# Patient Record
Sex: Male | Born: 1950 | Race: White | Hispanic: No | Marital: Married | Smoking: Current every day smoker
Health system: Southern US, Community
[De-identification: ages and names within clinical notes are randomized; demographics above are authoritative.]

## PROBLEM LIST (undated history)

## (undated) DIAGNOSIS — M1990 Unspecified osteoarthritis, unspecified site: Secondary | ICD-10-CM

## (undated) DIAGNOSIS — Z789 Other specified health status: Secondary | ICD-10-CM

## (undated) DIAGNOSIS — I10 Essential (primary) hypertension: Secondary | ICD-10-CM

## (undated) DIAGNOSIS — S52502A Unspecified fracture of the lower end of left radius, initial encounter for closed fracture: Secondary | ICD-10-CM

## (undated) DIAGNOSIS — L570 Actinic keratosis: Secondary | ICD-10-CM

## (undated) HISTORY — DX: Actinic keratosis: L57.0

## (undated) HISTORY — PX: COLONOSCOPY: SHX174

---

## 2007-06-06 ENCOUNTER — Ambulatory Visit: Payer: Self-pay | Admitting: Unknown Physician Specialty

## 2013-08-22 ENCOUNTER — Emergency Department: Payer: Self-pay | Admitting: Emergency Medicine

## 2018-02-21 ENCOUNTER — Ambulatory Visit (INDEPENDENT_AMBULATORY_CARE_PROVIDER_SITE_OTHER): Payer: Medicare Other | Admitting: Podiatry

## 2018-02-21 ENCOUNTER — Encounter: Payer: Self-pay | Admitting: Podiatry

## 2018-02-21 DIAGNOSIS — L600 Ingrowing nail: Secondary | ICD-10-CM

## 2018-02-21 NOTE — Progress Notes (Signed)
   Subjective:    Patient ID: Gary Whitehead, male    DOB: 1951-08-25, 67 y.o.   MRN: [id number]  HPI    Review of Systems  All other systems reviewed and are negative.      Objective:   Physical Exam        Assessment & Plan:

## 2018-02-23 NOTE — Progress Notes (Signed)
   Subjective: Patient presents today for evaluation of pain to the medial border of intermittent the left great toe that began about one year ago. He reports associated erythema. Patient is concerned for possible ingrown nail. Applying pressure to the area increases the pain. He has been trimming the nail for treatment but without much relief. Patient presents today for further treatment and evaluation.   No past medical history on file.    Objective:  General: Well developed, nourished, in no acute distress, alert and oriented x3   Dermatology: Skin is warm, dry and supple bilateral. Medial border of the left great toe appears to be erythematous with evidence of an ingrowing nail. Pain on palpation noted to the border of the nail fold. The remaining nails appear unremarkable at this time. There are no open sores, lesions.  Vascular: Dorsalis Pedis artery and Posterior Tibial artery pedal pulses palpable. No lower extremity edema noted.   Neruologic: Grossly intact via light touch bilateral.  Musculoskeletal: Muscular strength within normal limits in all groups bilateral. Normal range of motion noted to all pedal and ankle joints.   Assesement: #1 Paronychia with ingrowing nail medial border left great toe #2 Pain in toe #3 Incurvated nail  Plan of Care:  1. Patient evaluated.  2. Discussed treatment alternatives and plan of care. Explained nail avulsion procedure and post procedure course to patient. 3. Patient wants to have permanent partial nail avulsion in the fall and opts for conservative treatment at this time.  4. Mechanical debridement of the left great toenail performed using a nail nipper.  5. Recommended New Balance walking shoes.  6. Return to clinic as needed.   Busy during spring/summer. Drives a dump truck.   Edrick Kins, DPM [facility]  Dr. Edrick Kins, [place]                                        [place], [place]  [address]                Office [phone]  Fax [phone]

## 2019-08-17 ENCOUNTER — Inpatient Hospital Stay
Admission: EM | Admit: 2019-08-17 | Discharge: 2019-08-21 | DRG: 871 | Disposition: A | Discharge status: Home / Self Care | Payer: Medicare Other | Source: Ambulatory Visit | Attending: Internal Medicine | Admitting: Internal Medicine

## 2019-08-17 ENCOUNTER — Other Ambulatory Visit: Payer: Self-pay

## 2019-08-17 ENCOUNTER — Encounter: Payer: Self-pay | Admitting: Emergency Medicine

## 2019-08-17 ENCOUNTER — Emergency Department: Payer: Medicare Other

## 2019-08-17 DIAGNOSIS — J189 Pneumonia, unspecified organism: Secondary | ICD-10-CM | POA: Diagnosis present

## 2019-08-17 DIAGNOSIS — E876 Hypokalemia: Secondary | ICD-10-CM | POA: Diagnosis not present

## 2019-08-17 DIAGNOSIS — A419 Sepsis, unspecified organism: Principal | ICD-10-CM | POA: Diagnosis present

## 2019-08-17 DIAGNOSIS — Z87891 Personal history of nicotine dependence: Secondary | ICD-10-CM | POA: Diagnosis not present

## 2019-08-17 DIAGNOSIS — Z23 Encounter for immunization: Secondary | ICD-10-CM

## 2019-08-17 DIAGNOSIS — Z20828 Contact with and (suspected) exposure to other viral communicable diseases: Secondary | ICD-10-CM | POA: Diagnosis present

## 2019-08-17 HISTORY — DX: Other specified health status: Z78.9

## 2019-08-17 LAB — COMPREHENSIVE METABOLIC PANEL
ALT: 26 U/L (ref 0–44)
AST: 27 U/L (ref 15–41)
Albumin: 3.6 g/dL (ref 3.5–5.0)
Alkaline Phosphatase: 59 U/L (ref 38–126)
Anion gap: 16 — ABNORMAL HIGH (ref 5–15)
BUN: 12 mg/dL (ref 8–23)
CO2: 20 mmol/L — ABNORMAL LOW (ref 22–32)
Calcium: 8.4 mg/dL — ABNORMAL LOW (ref 8.9–10.3)
Chloride: 92 mmol/L — ABNORMAL LOW (ref 98–111)
Creatinine, Ser: 0.95 mg/dL (ref 0.61–1.24)
GFR calc Af Amer: >60 mL/min (ref >60)
GFR calc non Af Amer: >60 mL/min (ref >60)
Glucose, Bld: 195 mg/dL — ABNORMAL HIGH (ref 70–99)
Potassium: 3.6 mmol/L (ref 3.5–5.1)
Sodium: 128 mmol/L — ABNORMAL LOW (ref 135–145)
Total Bilirubin: 0.5 mg/dL (ref 0.3–1.2)
Total Protein: 7.6 g/dL (ref 6.5–8.1)

## 2019-08-17 LAB — CBC WITH DIFFERENTIAL/PLATELET
Abs Immature Granulocytes: 0.13 10*3/uL — ABNORMAL HIGH (ref 0.00–0.07)
Basophils Absolute: 0.1 10*3/uL (ref 0.0–0.1)
Basophils Relative: 1 %
Eosinophils Absolute: 0 10*3/uL (ref 0.0–0.5)
Eosinophils Relative: 0 %
HCT: 41.6 % (ref 39.0–52.0)
Hemoglobin: 13.8 g/dL (ref 13.0–17.0)
Immature Granulocytes: 1 %
Lymphocytes Relative: 6 %
Lymphs Abs: 0.8 10*3/uL (ref 0.7–4.0)
MCH: 29 pg (ref 26.0–34.0)
MCHC: 33.2 g/dL (ref 30.0–36.0)
MCV: 87.4 fL (ref 80.0–100.0)
Monocytes Absolute: 1.1 10*3/uL — ABNORMAL HIGH (ref 0.1–1.0)
Monocytes Relative: 8 %
Neutro Abs: 12.3 10*3/uL — ABNORMAL HIGH (ref 1.7–7.7)
Neutrophils Relative %: 84 %
Platelets: 263 10*3/uL (ref 150–400)
RBC: 4.76 MIL/uL (ref 4.22–5.81)
RDW: 13.7 % (ref 11.5–15.5)
WBC: 14.4 10*3/uL — ABNORMAL HIGH (ref 4.0–10.5)
nRBC: 0 % (ref 0.0–0.2)

## 2019-08-17 LAB — URINALYSIS, COMPLETE (UACMP) WITH MICROSCOPIC
Bilirubin Urine: NEGATIVE
Glucose, UA: NEGATIVE mg/dL
Ketones, ur: 5 mg/dL — AB
Leukocytes,Ua: NEGATIVE
Nitrite: NEGATIVE
Protein, ur: 30 mg/dL — AB
Specific Gravity, Urine: 1.004 — ABNORMAL LOW (ref 1.005–1.030)
pH: 6 (ref 5.0–8.0)

## 2019-08-17 LAB — PROTIME-INR
INR: 1 (ref 0.8–1.2)
Prothrombin Time: 12.9 seconds (ref 11.4–15.2)

## 2019-08-17 LAB — LACTIC ACID, PLASMA: Lactic Acid, Venous: 1.5 mmol/L (ref 0.5–1.9)

## 2019-08-17 LAB — TROPONIN I (HIGH SENSITIVITY): Troponin I (High Sensitivity): 14 ng/L (ref <18)

## 2019-08-17 MED ORDER — SODIUM CHLORIDE 0.9 % IV SOLN
500.0000 mg | INTRAVENOUS | Status: DC
  Administered 2019-08-17: 500 mg via INTRAVENOUS
  Filled 2019-08-17 (×2): qty 500

## 2019-08-17 MED ORDER — SODIUM CHLORIDE 0.9 % IV BOLUS (SEPSIS)
1000.0000 mL | Freq: Once | INTRAVENOUS | Status: AC
  Administered 2019-08-17: 1000 mL via INTRAVENOUS

## 2019-08-17 MED ORDER — SODIUM CHLORIDE 0.9 % IV SOLN
2.0000 g | INTRAVENOUS | Status: DC
  Administered 2019-08-17: 21:00:00 2 g via INTRAVENOUS
  Filled 2019-08-17 (×2): qty 20

## 2019-08-17 MED ORDER — SODIUM CHLORIDE 0.9 % IV BOLUS
1000.0000 mL | Freq: Once | INTRAVENOUS | Status: AC
  Administered 2019-08-17: 1000 mL via INTRAVENOUS

## 2019-08-17 MED ORDER — ACETAMINOPHEN 500 MG PO TABS
ORAL_TABLET | ORAL | Status: AC
  Administered 2019-08-17: 20:00:00 1000 mg
  Filled 2019-08-17: qty 2

## 2019-08-17 MED ORDER — SODIUM CHLORIDE 0.9 % IV BOLUS (SEPSIS)
1000.0000 mL | Freq: Once | INTRAVENOUS | Status: AC
  Administered 2019-08-17: 23:00:00 1000 mL via INTRAVENOUS

## 2019-08-17 NOTE — ED Notes (Signed)
ED TO INPATIENT HANDOFF REPORT  ED Nurse Name and Phone #: Bascom Levels Name/Age/Gender Gary Whitehead 68 y.o. male Room/Bed: ED08A/ED08A  Code Status   Code Status: Not on file  Home/SNF/Other Home Patient oriented to: self, place and time Is this baseline? No   Triage Complete: Triage complete  Chief Complaint sepsis  Triage Note Patient presents from [facility] with fever, cough, and altered mental status. Patient and wife went to urgent care after patient spiked fever of 103 at home, has non-productive cough. Wife noticed patient was confused and refused to leave urgent care without further investigation. Patient is confused about year, place, and situation   Allergies No Known Allergies  Level of Care/Admitting Diagnosis ED Disposition    ED Disposition Condition [place] [facility] Area: [place] [[id number]]  Level of Care: Telemetry [5]  Covid Evaluation: Person Under Investigation (PUI)  Diagnosis: Sepsis [facility] [id number]  Admitting Physician: Lance Coon [id number]  Attending Physician: Lance Coon [phone]  Estimated length of stay: past midnight tomorrow  Certification:: I certify this patient will need inpatient services for at least 2 midnights  Bed request comments: 2a  PT Class (Do Not Modify): Inpatient [101]  PT Acc Code (Do Not Modify): Private [1]       B Medical/Surgery History Past Medical History:  Diagnosis Date  . Patient denies medical problems    Past Surgical History:  Procedure Laterality Date  . NO PAST SURGERIES       A IV Location/Drains/Wounds Patient Lines/Drains/Airways Status   Active Line/Drains/Airways    Name:   Placement date:   Placement time:   Site:   Days:   Peripheral IV 08/17/19 Anterior;Left Wrist   08/17/19    -    Wrist   less than 1   Peripheral IV 08/17/19 Right Antecubital   08/17/19    2030    Antecubital   less than 1          Intake/Output Last 24 hours No intake or  output data in the 24 hours ending 08/17/19 2329  Labs/Imaging Results for orders placed or performed during the hospital encounter of 08/17/19 (from the past 48 hour(s))  Comprehensive metabolic panel     Status: Abnormal   Collection Time: 08/17/19  8:30 PM  Result Value Ref Range   Sodium 128 (L) 135 - 145 mmol/L   Potassium 3.6 3.5 - 5.1 mmol/L   Chloride 92 (L) 98 - 111 mmol/L   CO2 20 (L) 22 - 32 mmol/L   Glucose, Bld 195 (H) 70 - 99 mg/dL   BUN 12 8 - 23 mg/dL   Creatinine, Ser 0.95 0.61 - 1.24 mg/dL   Calcium 8.4 (L) 8.9 - 10.3 mg/dL   Total Protein 7.6 6.5 - 8.1 g/dL   Albumin 3.6 3.5 - 5.0 g/dL   AST 27 15 - 41 U/L   ALT 26 0 - 44 U/L   Alkaline Phosphatase 59 38 - 126 U/L   Total Bilirubin 0.5 0.3 - 1.2 mg/dL   GFR calc non Af Amer >60 >60 mL/min   GFR calc Af Amer >60 >60 mL/min   Anion gap 16 (H) 5 - 15    Comment: Performed at [facility], [address] [place], [place] [address]  Lactic acid, plasma     Status: None   Collection Time: 08/17/19  8:30 PM  Result Value Ref Range   Lactic Acid, Venous 1.5 0.5 - 1.9  mmol/L    Comment: Performed at [facility], [address] [place], [place] [address]  CBC with Differential     Status: Abnormal   Collection Time: 08/17/19  8:30 PM  Result Value Ref Range   WBC 14.4 (H) 4.0 - 10.5 K/uL   RBC 4.76 4.22 - 5.81 MIL/uL   Hemoglobin 13.8 13.0 - 17.0 g/dL   HCT 41.6 39.0 - 52.0 %   MCV 87.4 80.0 - 100.0 fL   MCH 29.0 26.0 - 34.0 pg   MCHC 33.2 30.0 - 36.0 g/dL   RDW 13.7 11.5 - 15.5 %   Platelets 263 150 - 400 K/uL   nRBC 0.0 0.0 - 0.2 %   Neutrophils Relative % 84 %   Neutro Abs 12.3 (H) 1.7 - 7.7 K/uL   Lymphocytes Relative 6 %   Lymphs Abs 0.8 0.7 - 4.0 K/uL   Monocytes Relative 8 %   Monocytes Absolute 1.1 (H) 0.1 - 1.0 K/uL   Eosinophils Relative 0 %   Eosinophils Absolute 0.0 0.0 - 0.5 K/uL   Basophils Relative 1 %   Basophils Absolute 0.1 0.0 - 0.1 K/uL   Immature Granulocytes 1  %   Abs Immature Granulocytes 0.13 (H) 0.00 - 0.07 K/uL    Comment: Performed at [facility], [address] [place], [place] [address]  Protime-INR     Status: None   Collection Time: 08/17/19  8:30 PM  Result Value Ref Range   Prothrombin Time 12.9 11.4 - 15.2 seconds   INR 1.0 0.8 - 1.2    Comment: (NOTE) INR goal varies based on device and disease states. Performed at [facility], [address] [place], [place] [address]   Urinalysis, Complete w Microscopic     Status: Abnormal   Collection Time: 08/17/19  8:30 PM  Result Value Ref Range   Color, Urine STRAW (A) YELLOW   APPearance CLEAR (A) CLEAR   Specific Gravity, Urine 1.004 (L) 1.005 - 1.030   pH 6.0 5.0 - 8.0   Glucose, UA NEGATIVE NEGATIVE mg/dL   Hgb urine dipstick SMALL (A) NEGATIVE   Bilirubin Urine NEGATIVE NEGATIVE   Ketones, ur 5 (A) NEGATIVE mg/dL   Protein, ur 30 (A) NEGATIVE mg/dL   Nitrite NEGATIVE NEGATIVE   Leukocytes,Ua NEGATIVE NEGATIVE   RBC / HPF 0-5 0 - 5 RBC/hpf   WBC, UA 0-5 0 - 5 WBC/hpf   Bacteria, UA RARE (A) NONE SEEN   Squamous Epithelial / LPF 0-5 0 - 5    Comment: Performed at [facility], [place], [place] [address]  Troponin I (High Sensitivity)     Status: None   Collection Time: 08/17/19  8:30 PM  Result Value Ref Range   Troponin I (High Sensitivity) 14 <18 ng/L    Comment: (NOTE) Elevated high sensitivity troponin I (hsTnI) values and significant  changes across serial measurements may suggest ACS but many other  chronic and acute conditions are known to elevate hsTnI results.  Refer to the "Links" section for chest pain algorithms and additional  guidance. Performed at [facility], [address], [place], [place] [address]    Dg Chest Pateros 1 View  Result Date: 08/17/2019 CLINICAL DATA:  68 year old male with altered mental status and fever. EXAM: PORTABLE CHEST 1 VIEW COMPARISON:  Chest CT dated 09/09/2014  FINDINGS: There is a patchy area of airspace opacity in the left upper lung field most consistent with infiltrate.  Clinical correlation and follow-up to resolution recommended. The right lung is clear. There is no pleural effusion or pneumothorax. The cardiac silhouette is within normal limits. No acute osseous pathology. IMPRESSION: Left upper lobe infiltrate.  Follow-up to resolution recommended. Electronically Signed   By: Anner Crete M.D.   On: 08/17/2019 20:45    Pending Labs Unresulted Labs (From admission, onward)    Start     Ordered   08/17/19 2041  Novel Coronavirus, NAA (Hosp order, Send-out to [facility]; TAT 18-24 hrs  (Symptomatic/High Risk of Exposure/Tier 1 Patients Labs with Precautions)  Once,   STAT    Question Answer Comment  Is this test for diagnosis or screening Diagnosis of ill patient   Symptomatic for COVID-19 as defined by CDC Yes   Date of Symptom Onset 08/15/2019   Hospitalized for COVID-19 Yes   Admitted to ICU for COVID-19 No   Previously tested for COVID-19 No   Resident in a congregate (group) care setting No   Employed in healthcare setting No      08/17/19 2041   08/17/19 2024  Culture, blood (Routine x 2)  BLOOD CULTURE X 2,   STAT     08/17/19 2024   Signed and Held  HIV antibody (Routine Testing)  Once,   R     Signed and Held   Signed and Held  CBC  (enoxaparin (LOVENOX)    CrCl >/= 30 ml/min)  Once,   R    Comments: Baseline for enoxaparin therapy IF NOT ALREADY DRAWN.  Notify MD if PLT < 100 K.    Signed and Held   Signed and Held  Creatinine, serum  (enoxaparin (LOVENOX)    CrCl >/= 30 ml/min)  Once,   R    Comments: Baseline for enoxaparin therapy IF NOT ALREADY DRAWN.    Signed and Held   Signed and Held  Creatinine, serum  (enoxaparin (LOVENOX)    CrCl >/= 30 ml/min)  Weekly,   R    Comments: while on enoxaparin therapy    Signed and Held   Signed and Held  Basic metabolic panel  Tomorrow morning,   R     Signed and Held   Signed and  Held  CBC  Tomorrow morning,   R     Signed and Held          Vitals/Pain Today's Vitals   08/17/19 2130 08/17/19 2200 08/17/19 2230 08/17/19 2300  BP: 118/69 131/70 121/66 117/77  Pulse: 97 97 94 90  Resp: (!) 23 19 (!) 30 (!) 28  Temp: 98.7 F (37.1 C)     TempSrc:      SpO2: 92% 96% 94% 93%  Weight:      Height:      PainSc:        Isolation Precautions Airborne and Contact precautions  Medications Medications  cefTRIAXone (ROCEPHIN) 2 g in sodium chloride 0.9 % 100 mL IVPB (0 g Intravenous Stopped 08/17/19 2129)  azithromycin (ZITHROMAX) 500 mg in sodium chloride 0.9 % 250 mL IVPB (0 mg Intravenous Stopped 08/17/19 2242)  acetaminophen (TYLENOL) 500 MG tablet (1,000 mg  Given 08/17/19 2024)  sodium chloride 0.9 % bolus 1,000 mL (0 mLs Intravenous Stopped 08/17/19 2323)    And  sodium chloride 0.9 % bolus 1,000 mL (0 mLs Intravenous Stopped 08/17/19 2242)  sodium chloride 0.9 % bolus 1,000 mL (0 mLs Intravenous Stopped 08/17/19 2242)    Mobility walks with person assist Moderate fall  risk   Focused Assessments Pulmonary Assessment Handoff:  Lung sounds:   O2 Device: Room Air        R Recommendations: See Admitting Provider Note  Report given to:   Additional Notes:

## 2019-08-17 NOTE — Progress Notes (Signed)
CODE SEPSIS - PHARMACY COMMUNICATION  **Broad Spectrum Antibiotics should be administered within 1 hour of Sepsis diagnosis**  Time Code Sepsis Called/Page Received:  8/24 @ 2036   Antibiotics Ordered:   Ceftriaxone   Time of 1st antibiotic administration: Ceftriaxone 2 gm IV X 1 given on 8/24 @ 2059   Additional action taken by pharmacy:   If necessary, Name of Provider/Nurse Contacted:     Aaric Dolph D ,PharmD Clinical Pharmacist  08/17/2019  9:31 PM

## 2019-08-17 NOTE — ED Provider Notes (Addendum)
[facility] Emergency Department Provider Note  ____________________________________________   First MD Initiated Contact with Patient 08/17/19 2024     (approximate)  I have reviewed the triage vital signs and the nursing notes.   HISTORY  Chief Complaint Cough and Fever    HPI Gary Whitehead is a 68 y.o. male here with fever and confusion and cough.  Patient presents via urgent care.  Per report from his wife, he has felt generally unwell for the last several days.  Is had cough productive of yellow-green sputum.  Has had generalized fatigue.  He went to urgent care today and was noted to be significantly febrile to 103.3, confused, and dyspneic.  He is not hypoxic.  On my arrival, he states he feels unwell but is unable to tell me why.  Denies any pain.  No known sick contacts.  His wife is not had similar symptoms.  States he feels thirsty and generalized fatigue.  No pain currently.  No abdominal pain, nausea, or vomiting.  No diarrhea.  No recent travel.  No known Exposures.        History reviewed. No pertinent past medical history.  There are no active problems to display for this patient.   History reviewed. No pertinent surgical history.  Prior to Admission medications   Medication Sig Start Date End Date Taking? Authorizing Provider  acetaminophen (TYLENOL) 325 MG tablet Take 650 mg by mouth every 6 (six) hours as needed.   Yes [provider]    Allergies Patient has no known allergies.  History reviewed. No pertinent family history.  Social History Social History   Tobacco Use  . Smoking status: Former Smoker    Packs/day: 1.50    Types: Cigarettes    Quit date: 06/24/2019    Years since quitting: 0.1  . Smokeless tobacco: Never Used  . Tobacco comment: 1 1/5 ppd  Substance Use Topics  . Alcohol use: No    Frequency: Never  . Drug use: No    Review of Systems  Review of Systems  Constitutional: Positive for  chills, fatigue and fever.  HENT: Negative for sore throat.   Respiratory: Positive for cough and shortness of breath.   Cardiovascular: Negative for chest pain.  Gastrointestinal: Negative for abdominal pain.  Genitourinary: Negative for flank pain.  Musculoskeletal: Negative for neck pain.  Skin: Negative for rash and wound.  Allergic/Immunologic: Negative for immunocompromised state.  Neurological: Positive for weakness. Negative for numbness.  Hematological: Does not bruise/bleed easily.  Psychiatric/Behavioral: Positive for confusion.  All other systems reviewed and are negative.    ____________________________________________  PHYSICAL EXAM:      VITAL SIGNS: ED Triage Vitals  Enc Vitals Group     BP 08/17/19 2033 (!) 141/85     Pulse Rate 08/17/19 2025 (!) 114     Resp 08/17/19 2025 (!) 29     Temp 08/17/19 2025 (!) 102.9 F (39.4 C)     Temp Source 08/17/19 2025 Oral     SpO2 08/17/19 2018 92 %     Weight 08/17/19 2026 196 lb (88.9 kg)     Height 08/17/19 2026 5\' 11"  (1.803 m)     Head Circumference --      Peak Flow --      Pain Score 08/17/19 2026 0     Pain Loc --      Pain Edu? --      Excl. in [place]? --  Physical Exam Vitals signs and nursing note reviewed.  Constitutional:      General: He is not in acute distress.    Appearance: He is well-developed. He is ill-appearing.  HENT:     Head: Normocephalic and atraumatic.  Eyes:     Conjunctiva/sclera: Conjunctivae normal.  Neck:     Musculoskeletal: Neck supple.  Cardiovascular:     Rate and Rhythm: Regular rhythm. Tachycardia present.     Heart sounds: Normal heart sounds. No murmur. No friction rub.  Pulmonary:     Effort: Pulmonary effort is normal. Tachypnea present. No respiratory distress.     Breath sounds: Decreased breath sounds and rales present. No wheezing.  Abdominal:     General: There is no distension.     Palpations: Abdomen is soft.     Tenderness: There is no abdominal  tenderness.  Skin:    General: Skin is warm.     Capillary Refill: Capillary refill takes less than 2 seconds.  Neurological:     Mental Status: He is alert and oriented to person, place, and time.     Motor: No abnormal muscle tone.       ____________________________________________   LABS (all labs ordered are listed, but only abnormal results are displayed)  Labs Reviewed  COMPREHENSIVE METABOLIC PANEL - Abnormal; Notable for the following components:      Result Value   Sodium 128 (*)    Chloride 92 (*)    CO2 20 (*)    Glucose, Bld 195 (*)    Calcium 8.4 (*)    Anion gap 16 (*)    All other components within normal limits  CBC WITH DIFFERENTIAL/PLATELET - Abnormal; Notable for the following components:   WBC 14.4 (*)    Neutro Abs 12.3 (*)    Monocytes Absolute 1.1 (*)    Abs Immature Granulocytes 0.13 (*)    All other components within normal limits  CULTURE, BLOOD (ROUTINE X 2)  CULTURE, BLOOD (ROUTINE X 2)  NOVEL CORONAVIRUS, NAA (HOSP ORDER, SEND-OUT TO REF LAB; TAT 18-24 HRS)  LACTIC ACID, PLASMA  PROTIME-INR  URINALYSIS, COMPLETE (UACMP) WITH MICROSCOPIC  TROPONIN I (HIGH SENSITIVITY)    ____________________________________________  EKG: Sinus tachycardia, ventricular rate 115.  QRS 98, QTc 435.  No ST depression, likely rate related. ________________________________________  RADIOLOGY All imaging, including plain films, CT scans, and ultrasounds, independently reviewed by me, and interpretations confirmed via formal radiology reads.  ED MD interpretation:   Chest x-ray: LUL PNA  Official radiology report(s): Dg Chest Port 1 View  Result Date: 08/17/2019 CLINICAL DATA:  68 year old male with altered mental status and fever. EXAM: PORTABLE CHEST 1 VIEW COMPARISON:  Chest CT dated 09/09/2014 FINDINGS: There is a patchy area of airspace opacity in the left upper lung field most consistent with infiltrate. Clinical correlation and follow-up to resolution  recommended. The right lung is clear. There is no pleural effusion or pneumothorax. The cardiac silhouette is within normal limits. No acute osseous pathology. IMPRESSION: Left upper lobe infiltrate.  Follow-up to resolution recommended. Electronically Signed   By: Anner Crete M.D.   On: 08/17/2019 20:45    ____________________________________________  PROCEDURES   Procedure(s) performed (including Critical Care):  .Critical Care Performed by: Duffy Bruce, MD Authorized by: Duffy Bruce, MD   Critical care provider statement:    Critical care time (minutes):  35   Critical care time was exclusive of:  Separately billable procedures and treating other patients and teaching time  Critical care was necessary to treat or prevent imminent or life-threatening deterioration of the following conditions:  Circulatory failure, cardiac failure, sepsis and respiratory failure   Critical care was time spent personally by me on the following activities:  Development of treatment plan with patient or surrogate, discussions with consultants, evaluation of patient's response to treatment, examination of patient, obtaining history from patient or surrogate, ordering and performing treatments and interventions, ordering and review of laboratory studies, ordering and review of radiographic studies, pulse oximetry, re-evaluation of patient's condition and review of old charts   I assumed direction of critical care for this patient from another provider in my specialty: no      ____________________________________________  INITIAL IMPRESSION / MDM / [place] / ED COURSE  As part of my medical decision making, I reviewed the following data within the electronic MEDICAL RECORD NUMBER Notes from prior ED visits and [place] Controlled Substance Database      *Gary Whitehead was evaluated in Emergency Department on 08/17/2019 for the symptoms described in the history of present illness. He was  evaluated in the context of the global COVID-19 pandemic, which necessitated consideration that the patient might be at risk for infection with the SARS-CoV-2 virus that causes COVID-19. Institutional protocols and algorithms that pertain to the evaluation of patients at risk for COVID-19 are in a state of rapid change based on information released by regulatory bodies including the CDC and federal and state organizations. These policies and algorithms were followed during the patient's care in the ED.  Some ED evaluations and interventions may be delayed as a result of limited staffing during the pandemic.*      Medical Decision Making: 68 year old male here with sepsis likely secondary to community-acquired pneumonia.  Differential includes viral pneumonia or COVID, though history and x-ray is less consistent with this.  Patient mildly confused which I suspect is due to sepsis and fever.  He appears markedly improved after Tylenol.  No headache, neck pain, neck symptoms, or signs of meningitis or encephalitis.  He was activated as a code sepsis with broad-spectrum antibiotics and IV fluids.  Will admit.  ____________________________________________  FINAL CLINICAL IMPRESSION(S) / ED DIAGNOSES  Final diagnoses:  Sepsis due to pneumonia [facility])     MEDICATIONS GIVEN DURING THIS VISIT:  Medications  cefTRIAXone (ROCEPHIN) 2 g in sodium chloride 0.9 % 100 mL IVPB (0 g Intravenous Stopped 08/17/19 2129)  azithromycin (ZITHROMAX) 500 mg in sodium chloride 0.9 % 250 mL IVPB (500 mg Intravenous New Bag/Given 08/17/19 2130)  sodium chloride 0.9 % bolus 1,000 mL (has no administration in time range)    And  sodium chloride 0.9 % bolus 1,000 mL (1,000 mLs Intravenous New Bag/Given 08/17/19 2059)  acetaminophen (TYLENOL) 500 MG tablet (1,000 mg  Given 08/17/19 2024)  sodium chloride 0.9 % bolus 1,000 mL (1,000 mLs Intravenous New Bag/Given 08/17/19 2053)     ED Discharge Orders    None       Note:   This document was prepared using Dragon voice recognition software and may include unintentional dictation errors.   Duffy Bruce, MD 08/17/19 2137    Duffy Bruce, MD 08/17/19 2138

## 2019-08-17 NOTE — ED Notes (Signed)
Patient updated wife via personal cell phone

## 2019-08-17 NOTE — ED Triage Notes (Signed)
Patient presents from [facility] with fever, cough, and altered mental status. Patient and wife went to urgent care after patient spiked fever of 103 at home, has non-productive cough. Wife noticed patient was confused and refused to leave urgent care without further investigation. Patient is confused about year, place, and situation

## 2019-08-17 NOTE — H&P (Signed)
[facility] [place] NAME: Gary Whitehead    MR#:  [id number]  DATE OF BIRTH:  1951-01-13  DATE OF ADMISSION:  08/17/2019  PRIMARY CARE PHYSICIAN: Patient, No Pcp Per   REQUESTING/REFERRING PHYSICIAN: Ellender Hose, MD  CHIEF COMPLAINT:   Chief Complaint  Patient presents with  . Cough  . Fever    HISTORY OF PRESENT ILLNESS:  Gary Whitehead  is a 68 y.o. male who presents with chief complaint as above.  Patient presents the ED with a complaint of cough for the past few days, and fever and confusion today.  In the ED he is found to have pneumonia on x-ray imaging.  He does meet sepsis criteria with fever and leukocytosis.  Hospitalist were called for admission  PAST MEDICAL HISTORY:   Past Medical History:  Diagnosis Date  . Patient denies medical problems      PAST SURGICAL HISTORY:   Past Surgical History:  Procedure Laterality Date  . NO PAST SURGERIES       SOCIAL HISTORY:   Social History   Tobacco Use  . Smoking status: Former Smoker    Packs/day: 1.50    Types: Cigarettes    Quit date: 06/24/2019    Years since quitting: 0.1  . Smokeless tobacco: Never Used  . Tobacco comment: 1 1/5 ppd  Substance Use Topics  . Alcohol use: No    Frequency: Never     FAMILY HISTORY:    Family history reviewed and is non-contributory DRUG ALLERGIES:  No Known Allergies  MEDICATIONS AT HOME:   Prior to Admission medications   Medication Sig Start Date End Date Taking? Authorizing Provider  acetaminophen (TYLENOL) 325 MG tablet Take 650 mg by mouth every 6 (six) hours as needed.   Yes [provider]    REVIEW OF SYSTEMS:  Review of Systems  Constitutional: Positive for fever and malaise/fatigue. Negative for chills and weight loss.  HENT: Negative for ear pain, hearing loss and tinnitus.   Eyes: Negative for blurred vision, double vision, pain and redness.  Respiratory: Positive for cough. Negative for  hemoptysis and shortness of breath.   Cardiovascular: Negative for chest pain, palpitations, orthopnea and leg swelling.  Gastrointestinal: Negative for abdominal pain, constipation, diarrhea, nausea and vomiting.  Genitourinary: Negative for dysuria, frequency and hematuria.  Musculoskeletal: Negative for back pain, joint pain and neck pain.  Skin:       No acne, rash, or lesions  Neurological: Negative for dizziness, tremors, focal weakness and weakness.  Endo/Heme/Allergies: Negative for polydipsia. Does not bruise/bleed easily.  Psychiatric/Behavioral: Negative for depression. The patient is not nervous/anxious and does not have insomnia.      VITAL SIGNS:   Vitals:   08/17/19 2130 08/17/19 2200 08/17/19 2230 08/17/19 2300  BP: 118/69 131/70 121/66 117/77  Pulse: 97 97 94 90  Resp: (!) 23 19 (!) 30 (!) 28  Temp: 98.7 F (37.1 C)     TempSrc:      SpO2: 92% 96% 94% 93%  Weight:      Height:       Wt Readings from Last 3 Encounters:  08/17/19 88.9 kg    PHYSICAL EXAMINATION:  Physical Exam  Vitals reviewed. Constitutional: He is oriented to person, place, and time. He appears well-developed and well-nourished. No distress.  HENT:  Head: Normocephalic and atraumatic.  Mouth/Throat: Oropharynx is clear and moist.  Eyes: Pupils are equal, round, and reactive to light. Conjunctivae and EOM  are normal. No scleral icterus.  Neck: Normal range of motion. Neck supple. No JVD present. No thyromegaly present.  Cardiovascular: Regular rhythm and intact distal pulses. Exam reveals no gallop and no friction rub.  No murmur heard. Borderline tachycardic  Respiratory: Effort normal. No respiratory distress. He has no wheezes. He has no rales.  Coarse breath sounds  GI: Soft. Bowel sounds are normal. He exhibits no distension. There is no abdominal tenderness.  Musculoskeletal: Normal range of motion.        General: No edema.     Comments: No arthritis, no gout  Lymphadenopathy:     He has no cervical adenopathy.  Neurological: He is alert and oriented to person, place, and time. No cranial nerve deficit.  No dysarthria, no aphasia  Skin: Skin is warm and dry. No rash noted. No erythema.  Psychiatric: He has a normal mood and affect. His behavior is normal. Judgment and thought content normal.    LABORATORY PANEL:   CBC Recent Labs  Lab 08/17/19 2030  WBC 14.4*  HGB 13.8  HCT 41.6  PLT 263   ------------------------------------------------------------------------------------------------------------------  Chemistries  Recent Labs  Lab 08/17/19 2030  NA 128*  K 3.6  CL 92*  CO2 20*  GLUCOSE 195*  BUN 12  CREATININE 0.95  CALCIUM 8.4*  AST 27  ALT 26  ALKPHOS 59  BILITOT 0.5   ------------------------------------------------------------------------------------------------------------------  Cardiac Enzymes No results for input(s): TROPONINI in the last 168 hours. ------------------------------------------------------------------------------------------------------------------  RADIOLOGY:  Dg Chest Port 1 View  Result Date: 08/17/2019 CLINICAL DATA:  68 year old male with altered mental status and fever. EXAM: PORTABLE CHEST 1 VIEW COMPARISON:  Chest CT dated 09/09/2014 FINDINGS: There is a patchy area of airspace opacity in the left upper lung field most consistent with infiltrate. Clinical correlation and follow-up to resolution recommended. The right lung is clear. There is no pleural effusion or pneumothorax. The cardiac silhouette is within normal limits. No acute osseous pathology. IMPRESSION: Left upper lobe infiltrate.  Follow-up to resolution recommended. Electronically Signed   By: Anner Crete M.D.   On: 08/17/2019 20:45    EKG:   Orders placed or performed during the hospital encounter of 08/17/19  . EKG 12-Lead  . EKG 12-Lead  . EKG 12-Lead  . EKG 12-Lead  . ED EKG  . ED EKG  . EKG 12-Lead  . EKG 12-Lead  . ED EKG  12-Lead  . ED EKG 12-Lead    IMPRESSION AND PLAN:  Principal Problem:   Sepsis ([place]) -lactic acid normal limits.  Blood pressure stable.  Sepsis is due to pneumonia.  IV antibiotics given.  Culture sent. Active Problems:   CAP (community acquired pneumonia) -IV antibiotics, supportive treatment, COVID test pending  Chart review performed and case discussed with ED provider. Labs, imaging and/or ECG reviewed by provider and discussed with patient/family. Management plans discussed with the patient and/or family.  COVID-19 status: Pending  DVT PROPHYLAXIS: SubQ lovenox   GI PROPHYLAXIS:  None  ADMISSION STATUS: Inpatient     CODE STATUS: Full  TOTAL TIME TAKING CARE OF THIS PATIENT: 45 minutes.   This patient was evaluated in the context of the global COVID-19 pandemic, which necessitated consideration that the patient might be at risk for infection with the SARS-CoV-2 virus that causes COVID-19. Institutional protocols and algorithms that pertain to the evaluation of patients at risk for COVID-19 are in a state of rapid change based on information released by regulatory bodies including the CDC and  federal and state organizations. These policies and algorithms were followed to the best of this provider's knowledge to date during the patient's care at this facility.  Ethlyn Daniels 08/17/2019, 11:27 PM  [facility]  Office  [phone]  CC: Primary care physician; Patient, No Pcp Per  Note:  This document was prepared using Dragon voice recognition software and may include unintentional dictation errors.

## 2019-08-18 ENCOUNTER — Other Ambulatory Visit: Payer: Self-pay

## 2019-08-18 LAB — BASIC METABOLIC PANEL
Anion gap: 10 (ref 5–15)
BUN: 11 mg/dL (ref 8–23)
CO2: 24 mmol/L (ref 22–32)
Calcium: 8.2 mg/dL — ABNORMAL LOW (ref 8.9–10.3)
Chloride: 102 mmol/L (ref 98–111)
Creatinine, Ser: 0.98 mg/dL (ref 0.61–1.24)
GFR calc Af Amer: >60 mL/min (ref >60)
GFR calc non Af Amer: >60 mL/min (ref >60)
Glucose, Bld: 181 mg/dL — ABNORMAL HIGH (ref 70–99)
Potassium: 3.4 mmol/L — ABNORMAL LOW (ref 3.5–5.1)
Sodium: 136 mmol/L (ref 135–145)

## 2019-08-18 LAB — CBC
HCT: 38.4 % — ABNORMAL LOW (ref 39.0–52.0)
Hemoglobin: 12.7 g/dL — ABNORMAL LOW (ref 13.0–17.0)
MCH: 29.2 pg (ref 26.0–34.0)
MCHC: 33.1 g/dL (ref 30.0–36.0)
MCV: 88.3 fL (ref 80.0–100.0)
Platelets: 289 10*3/uL (ref 150–400)
RBC: 4.35 MIL/uL (ref 4.22–5.81)
RDW: 13.9 % (ref 11.5–15.5)
WBC: 12.2 10*3/uL — ABNORMAL HIGH (ref 4.0–10.5)
nRBC: 0 % (ref 0.0–0.2)

## 2019-08-18 MED ORDER — ONDANSETRON HCL 4 MG PO TABS: 4.0000 mg | ORAL_TABLET | Freq: Four times a day (QID) | ORAL | Status: DC | PRN

## 2019-08-18 MED ORDER — ENOXAPARIN SODIUM 40 MG/0.4ML ~~LOC~~ SOLN
40.0000 mg | SUBCUTANEOUS | Status: DC
  Administered 2019-08-18: 40 mg via SUBCUTANEOUS
  Filled 2019-08-18: qty 0.4

## 2019-08-18 MED ORDER — IPRATROPIUM-ALBUTEROL 20-100 MCG/ACT IN AERS
1.0000 | INHALATION_SPRAY | Freq: Four times a day (QID) | RESPIRATORY_TRACT | Status: DC | PRN
  Filled 2019-08-18: qty 4

## 2019-08-18 MED ORDER — ACETAMINOPHEN 325 MG PO TABS
650.0000 mg | ORAL_TABLET | Freq: Four times a day (QID) | ORAL | Status: DC | PRN
  Administered 2019-08-18 – 2019-08-20 (×7): 650 mg via ORAL
  Filled 2019-08-18 (×7): qty 2

## 2019-08-18 MED ORDER — POTASSIUM CHLORIDE CRYS ER 20 MEQ PO TBCR
40.0000 meq | EXTENDED_RELEASE_TABLET | Freq: Once | ORAL | Status: AC
  Administered 2019-08-18: 40 meq via ORAL
  Filled 2019-08-18: qty 2

## 2019-08-18 MED ORDER — SODIUM CHLORIDE 0.9 % IV SOLN
500.0000 mg | INTRAVENOUS | Status: DC
  Administered 2019-08-18 – 2019-08-19 (×2): 500 mg via INTRAVENOUS
  Filled 2019-08-18 (×3): qty 500

## 2019-08-18 MED ORDER — ONDANSETRON HCL 4 MG/2ML IJ SOLN: 4.0000 mg | Freq: Four times a day (QID) | INTRAMUSCULAR | Status: DC | PRN

## 2019-08-18 MED ORDER — ENOXAPARIN SODIUM 40 MG/0.4ML ~~LOC~~ SOLN
40.0000 mg | SUBCUTANEOUS | Status: DC
  Administered 2019-08-18 – 2019-08-20 (×3): 40 mg via SUBCUTANEOUS
  Filled 2019-08-18 (×3): qty 0.4

## 2019-08-18 MED ORDER — GUAIFENESIN-DM 100-10 MG/5ML PO SYRP: 5.0000 mL | ORAL_SOLUTION | ORAL | Status: DC | PRN

## 2019-08-18 MED ORDER — ACETAMINOPHEN 650 MG RE SUPP: 650.0000 mg | Freq: Four times a day (QID) | RECTAL | Status: DC | PRN

## 2019-08-18 MED ORDER — BENZONATATE 100 MG PO CAPS: 200.0000 mg | ORAL_CAPSULE | Freq: Three times a day (TID) | ORAL | Status: DC | PRN

## 2019-08-18 MED ORDER — PNEUMOCOCCAL VAC POLYVALENT 25 MCG/0.5ML IJ INJ
0.5000 mL | INJECTION | INTRAMUSCULAR | Status: AC
  Administered 2019-08-19: 0.5 mL via INTRAMUSCULAR
  Filled 2019-08-18: qty 0.5

## 2019-08-18 MED ORDER — SODIUM CHLORIDE 0.9 % IV SOLN
2.0000 g | INTRAVENOUS | Status: DC
  Administered 2019-08-18 – 2019-08-19 (×2): 2 g via INTRAVENOUS
  Filled 2019-08-18: qty 2
  Filled 2019-08-18: qty 20
  Filled 2019-08-18: qty 2

## 2019-08-18 NOTE — Progress Notes (Signed)
Advanced care plan. Purpose of the Encounter: CODE STATUS Parties in Attendance: Patient Patient's Decision Capacity: Good Subjective/Patient's story: Gary Whitehead  is a 68 y.o. male who presents with chief complaint as above.  Patient presents the ED with a complaint of cough for the past few days, and fever and confusion today.  In the ED he is found to have pneumonia on x-ray imaging.  He does meet sepsis criteria with fever and leukocytosis.  Hospitalist were called for admission Objective/Medical story Patient needs IV antibiotics for treatment of pneumonia Needs IV fluids and antibiotic therapy for sepsis secondary to pneumonia COVID-19 testing Goals of care determination:  Advance care directives goals of care treatment plan discussed with patient in detail For now he wants everything done which includes CPR, intubation ventilator if the need arises. CODE STATUS: Full code Time spent discussing advanced care planning: 16 minutes

## 2019-08-18 NOTE — Plan of Care (Signed)
  Problem: Education: Goal: Knowledge of General Education information will improve Description: Including pain rating scale, medication(s)/side effects and non-pharmacologic comfort measures Outcome: Progressing Note: Patient profile completed. No pain stated. Patient has an elevated temperature. Patient needs assistance to a restroom.

## 2019-08-18 NOTE — Plan of Care (Signed)
  Problem: Education: Goal: Knowledge of General Education information will improve Description: Including pain rating scale, medication(s)/side effects and non-pharmacologic comfort measures Outcome: Progressing   Problem: Health Behavior/Discharge Planning: Goal: Ability to manage health-related needs will improve Outcome: Progressing   Problem: Clinical Measurements: Goal: Ability to maintain clinical measurements within normal limits will improve Outcome: Not Progressing Note: Potassium level was low this AM at only 3.4. PO replacement administered. Will continue to monitor lab values. Wenda Low [facility]

## 2019-08-18 NOTE — Progress Notes (Signed)
[facility] [place] NAME: Gary Whitehead    MR#:  [id number]  DATE OF BIRTH:  1951-06-29  SUBJECTIVE:  CHIEF COMPLAINT:   Chief Complaint  Patient presents with  . Cough  . Fever  Patient seen and evaluated today Has cough productive of phlegm Difficulty breathing Has fever Comfortable on oxygen via nasal cannula  REVIEW OF SYSTEMS:    ROS  CONSTITUTIONAL: No documented fever. Has fatigue, weakness. No weight gain, no weight loss.  EYES: No blurry or double vision.  ENT: No tinnitus. No postnasal drip. No redness of the oropharynx.  RESPIRATORY: Has cough, no wheeze, no hemoptysis. Has dyspnea.  CARDIOVASCULAR: No chest pain. No orthopnea. No palpitations. No syncope.  GASTROINTESTINAL: No nausea, no vomiting or diarrhea. No abdominal pain. No melena or hematochezia.  GENITOURINARY: No dysuria or hematuria.  ENDOCRINE: No polyuria or nocturia. No heat or cold intolerance.  HEMATOLOGY: No anemia. No bruising. No bleeding.  INTEGUMENTARY: No rashes. No lesions.  MUSCULOSKELETAL: No arthritis. No swelling. No gout.  NEUROLOGIC: No numbness, tingling, or ataxia. No seizure-type activity.  PSYCHIATRIC: No anxiety. No insomnia. No ADD.   DRUG ALLERGIES:  No Known Allergies  VITALS:  Blood pressure (!) 154/77, pulse (!) 117, temperature (!) 101.1 F (38.4 C), temperature source Oral, resp. rate 19, height 5\' 11"  (1.803 m), weight 87.9 kg, SpO2 96 %.  PHYSICAL EXAMINATION:   Physical Exam  GENERAL:  68 y.o.-year-old patient lying in the bed with no acute distress.  EYES: Pupils equal, round, reactive to light and accommodation. No scleral icterus. Extraocular muscles intact.  HEENT: Head atraumatic, normocephalic. Oropharynx and nasopharynx clear.  NECK:  Supple, no jugular venous distention. No thyroid enlargement, no tenderness.  LUNGS:Decreased breath sounds bilaterally, left lung Rales heard. No use of accessory muscles of  respiration.  CARDIOVASCULAR: S1, S2 tachycardia noted. No murmurs, rubs, or gallops.  ABDOMEN: Soft, nontender, nondistended. Bowel sounds present. No organomegaly or mass.  EXTREMITIES: No cyanosis, clubbing or edema b/l.    NEUROLOGIC: Cranial nerves II through XII are intact. No focal Motor or sensory deficits b/l.   PSYCHIATRIC: The patient is alert and oriented x 3.  SKIN: No obvious rash, lesion, or ulcer.   LABORATORY PANEL:   CBC Recent Labs  Lab 08/18/19 0539  WBC 12.2*  HGB 12.7*  HCT 38.4*  PLT 289   ------------------------------------------------------------------------------------------------------------------ Chemistries  Recent Labs  Lab 08/17/19 2030 08/18/19 0539  NA 128* 136  K 3.6 3.4*  CL 92* 102  CO2 20* 24  GLUCOSE 195* 181*  BUN 12 11  CREATININE 0.95 0.98  CALCIUM 8.4* 8.2*  AST 27  --   ALT 26  --   ALKPHOS 59  --   BILITOT 0.5  --    ------------------------------------------------------------------------------------------------------------------  Cardiac Enzymes No results for input(s): TROPONINI in the last 168 hours. ------------------------------------------------------------------------------------------------------------------  RADIOLOGY:  Dg Chest Port 1 View  Result Date: 08/17/2019 CLINICAL DATA:  68 year old male with altered mental status and fever. EXAM: PORTABLE CHEST 1 VIEW COMPARISON:  Chest CT dated 09/09/2014 FINDINGS: There is a patchy area of airspace opacity in the left upper lung field most consistent with infiltrate. Clinical correlation and follow-up to resolution recommended. The right lung is clear. There is no pleural effusion or pneumothorax. The cardiac silhouette is within normal limits. No acute osseous pathology. IMPRESSION: Left upper lobe infiltrate.  Follow-up to resolution recommended. Electronically Signed   By: Laren Everts.D.  On: 08/17/2019 20:45     ASSESSMENT AND PLAN:  68 year old male  patient with no significant past medical history currently under hospitalist service  -Sepsis secondary to pneumonia IV fluids IV antibiotics Follow-up cultures  -Community-acquired pneumonia Continue IV Rocephin and Zithromax antibiotic  -COVID-19 test pending  -Acute hypokalemia Replace potassium aggressively  -DVT prophylaxis subcu Lovenox daily  All the records are reviewed and case discussed with Care Management/Social Worker. Management plans discussed with the patient, family and they are in agreement.  CODE STATUS: Full code  DVT Prophylaxis: SCDs  TOTAL TIME TAKING CARE OF THIS PATIENT: 36 minutes.   POSSIBLE D/C IN 2 to 3 DAYS, DEPENDING ON CLINICAL CONDITION.  Saundra Shelling M.D on 08/18/2019 at 10:28 AM  Between 7am to 6pm - Pager - [phone]  After 6pm go to [email or web address] - password [email or web address] [facility]  Office  [phone]  CC: Primary care physician; Patient, No Pcp Per  Note: This dictation was prepared with Dragon dictation along with smaller phrase technology. Any transcriptional errors that result from this process are unintentional.

## 2019-08-19 ENCOUNTER — Inpatient Hospital Stay: Payer: Medicare Other

## 2019-08-19 LAB — BASIC METABOLIC PANEL
Anion gap: 10 (ref 5–15)
BUN: 12 mg/dL (ref 8–23)
CO2: 25 mmol/L (ref 22–32)
Calcium: 8.1 mg/dL — ABNORMAL LOW (ref 8.9–10.3)
Chloride: 101 mmol/L (ref 98–111)
Creatinine, Ser: 0.78 mg/dL (ref 0.61–1.24)
GFR calc Af Amer: >60 mL/min (ref >60)
GFR calc non Af Amer: >60 mL/min (ref >60)
Glucose, Bld: 171 mg/dL — ABNORMAL HIGH (ref 70–99)
Potassium: 3.5 mmol/L (ref 3.5–5.1)
Sodium: 136 mmol/L (ref 135–145)

## 2019-08-19 LAB — HIV ANTIBODY (ROUTINE TESTING W REFLEX): HIV Screen 4th Generation wRfx: NONREACTIVE

## 2019-08-19 LAB — CBC
HCT: 38.5 % — ABNORMAL LOW (ref 39.0–52.0)
Hemoglobin: 12.9 g/dL — ABNORMAL LOW (ref 13.0–17.0)
MCH: 29 pg (ref 26.0–34.0)
MCHC: 33.5 g/dL (ref 30.0–36.0)
MCV: 86.5 fL (ref 80.0–100.0)
Platelets: 279 10*3/uL (ref 150–400)
RBC: 4.45 MIL/uL (ref 4.22–5.81)
RDW: 14.3 % (ref 11.5–15.5)
WBC: 10 10*3/uL (ref 4.0–10.5)
nRBC: 0 % (ref 0.0–0.2)

## 2019-08-19 LAB — NOVEL CORONAVIRUS, NAA (HOSP ORDER, SEND-OUT TO REF LAB; TAT 18-24 HRS): SARS-CoV-2, NAA: NOT DETECTED

## 2019-08-19 MED ORDER — IOHEXOL 300 MG/ML  SOLN
75.0000 mL | Freq: Once | INTRAMUSCULAR | Status: AC | PRN
  Administered 2019-08-19: 19:00:00 75 mL via INTRAVENOUS

## 2019-08-19 MED ORDER — POTASSIUM CHLORIDE CRYS ER 20 MEQ PO TBCR
20.0000 meq | EXTENDED_RELEASE_TABLET | Freq: Once | ORAL | Status: AC
  Administered 2019-08-19: 20 meq via ORAL
  Filled 2019-08-19: qty 1

## 2019-08-19 MED ORDER — SODIUM CHLORIDE 0.9 % IV SOLN
INTRAVENOUS | Status: DC | PRN
  Administered 2019-08-19: 21:00:00 250 mL via INTRAVENOUS
  Administered 2019-08-20 – 2019-08-21 (×2): 500 mL via INTRAVENOUS

## 2019-08-19 NOTE — Progress Notes (Signed)
Patient's wife Maxwel Kuka updated over the phone.

## 2019-08-19 NOTE — Plan of Care (Signed)
  Problem: Clinical Measurements: Goal: Ability to maintain clinical measurements within normal limits will improve Outcome: Progressing   Problem: Safety: Goal: Ability to remain free from injury will improve Outcome: Progressing   Problem: Respiratory: Goal: Ability to maintain adequate ventilation will improve Outcome: Progressing Goal: Ability to maintain a clear airway will improve Outcome: Progressing   Problem: Clinical Measurements: Goal: Ability to maintain a body temperature in the normal range will improve 08/19/2019 0236 by Loran Senters, RN Outcome: Not Progressing Note: Patient was febrile during the beginning of night; fever responded well to tylenol 08/19/2019 0235 by Loran Senters, RN Outcome: Progressing

## 2019-08-19 NOTE — Progress Notes (Signed)
[facility] [place] NAME: Gary Whitehead    MR#:  [id number]  DATE OF BIRTH:  1951-01-21  SUBJECTIVE:  CHIEF COMPLAINT:   Chief Complaint  Patient presents with  . Cough  . Fever  Patient seen and evaluated today Has decreased cough productive of phlegm Difficulty breathing improved Had fever last night Weaned off oxygen  REVIEW OF SYSTEMS:    ROS  CONSTITUTIONAL: No documented fever. Has fatigue, weakness. No weight gain, no weight loss.  EYES: No blurry or double vision.  ENT: No tinnitus. No postnasal drip. No redness of the oropharynx.  RESPIRATORY: Has cough, no wheeze, no hemoptysis. Has dyspnea.  CARDIOVASCULAR: No chest pain. No orthopnea. No palpitations. No syncope.  GASTROINTESTINAL: No nausea, no vomiting or diarrhea. No abdominal pain. No melena or hematochezia.  GENITOURINARY: No dysuria or hematuria.  ENDOCRINE: No polyuria or nocturia. No heat or cold intolerance.  HEMATOLOGY: No anemia. No bruising. No bleeding.  INTEGUMENTARY: No rashes. No lesions.  MUSCULOSKELETAL: No arthritis. No swelling. No gout.  NEUROLOGIC: No numbness, tingling, or ataxia. No seizure-type activity.  PSYCHIATRIC: No anxiety. No insomnia. No ADD.   DRUG ALLERGIES:  No Known Allergies  VITALS:  Blood pressure (!) 141/86, pulse 91, temperature 98.6 F (37 C), temperature source Oral, resp. rate 18, height 5\' 11"  (1.803 m), weight 85.5 kg, SpO2 96 %.  PHYSICAL EXAMINATION:   Physical Exam  GENERAL:  68 y.o.-year-old patient lying in the bed with no acute distress.  EYES: Pupils equal, round, reactive to light and accommodation. No scleral icterus. Extraocular muscles intact.  HEENT: Head atraumatic, normocephalic. Oropharynx and nasopharynx clear.  NECK:  Supple, no jugular venous distention. No thyroid enlargement, no tenderness.  LUNGS: Improved breath sounds bilaterally, left lung Rales heard. No use of accessory muscles of  respiration.  CARDIOVASCULAR: S1, S2 tachycardia noted. No murmurs, rubs, or gallops.  ABDOMEN: Soft, nontender, nondistended. Bowel sounds present. No organomegaly or mass.  EXTREMITIES: No cyanosis, clubbing or edema b/l.    NEUROLOGIC: Cranial nerves II through XII are intact. No focal Motor or sensory deficits b/l.   PSYCHIATRIC: The patient is alert and oriented x 3.  SKIN: No obvious rash, lesion, or ulcer.   LABORATORY PANEL:   CBC Recent Labs  Lab 08/19/19 0550  WBC 10.0  HGB 12.9*  HCT 38.5*  PLT 279   ------------------------------------------------------------------------------------------------------------------ Chemistries  Recent Labs  Lab 08/17/19 2030  08/19/19 0550  NA 128*   < > 136  K 3.6   < > 3.5  CL 92*   < > 101  CO2 20*   < > 25  GLUCOSE 195*   < > 171*  BUN 12   < > 12  CREATININE 0.95   < > 0.78  CALCIUM 8.4*   < > 8.1*  AST 27  --   --   ALT 26  --   --   ALKPHOS 59  --   --   BILITOT 0.5  --   --    < > = values in this interval not displayed.   ------------------------------------------------------------------------------------------------------------------  Cardiac Enzymes No results for input(s): TROPONINI in the last 168 hours. ------------------------------------------------------------------------------------------------------------------  RADIOLOGY:  Dg Chest Port 1 View  Result Date: 08/17/2019 CLINICAL DATA:  68 year old male with altered mental status and fever. EXAM: PORTABLE CHEST 1 VIEW COMPARISON:  Chest CT dated 09/09/2014 FINDINGS: There is a patchy area of airspace opacity in the left upper lung  field most consistent with infiltrate. Clinical correlation and follow-up to resolution recommended. The right lung is clear. There is no pleural effusion or pneumothorax. The cardiac silhouette is within normal limits. No acute osseous pathology. IMPRESSION: Left upper lobe infiltrate.  Follow-up to resolution recommended.  Electronically Signed   By: Anner Crete M.D.   On: 08/17/2019 20:45     ASSESSMENT AND PLAN:  68 year old male patient with no significant past medical history currently under hospitalist service  -Sepsis secondary to pneumonia Resolving IV fluids IV antibiotics Follow-up cultures  -Community-acquired pneumonia Improving Continue IV Rocephin and Zithromax antibiotic  -COVID-19 test pending Follow-up test results  -Acute hypokalemia Replace potassium aggressively  -DVT prophylaxis subcu Lovenox daily  All the records are reviewed and case discussed with Care Management/Social Worker. Management plans discussed with the patient, family and they are in agreement.  CODE STATUS: Full code  DVT Prophylaxis: SCDs  TOTAL TIME TAKING CARE OF THIS PATIENT: 34 minutes.   POSSIBLE D/C IN 2 to 3 DAYS, DEPENDING ON CLINICAL CONDITION.  Saundra Shelling M.D on 08/19/2019 at 12:25 PM  Between 7am to 6pm - Pager - [phone]  After 6pm go to [email or web address] - password [email or web address] [facility]  Office  [phone]  CC: Primary care physician; Patient, No Pcp Per  Note: This dictation was prepared with Dragon dictation along with smaller phrase technology. Any transcriptional errors that result from this process are unintentional.

## 2019-08-20 LAB — CBC
HCT: 38.5 % — ABNORMAL LOW (ref 39.0–52.0)
Hemoglobin: 12.9 g/dL — ABNORMAL LOW (ref 13.0–17.0)
MCH: 29.2 pg (ref 26.0–34.0)
MCHC: 33.5 g/dL (ref 30.0–36.0)
MCV: 87.1 fL (ref 80.0–100.0)
Platelets: 303 10*3/uL (ref 150–400)
RBC: 4.42 MIL/uL (ref 4.22–5.81)
RDW: 14.2 % (ref 11.5–15.5)
WBC: 11.2 10*3/uL — ABNORMAL HIGH (ref 4.0–10.5)
nRBC: 0 % (ref 0.0–0.2)

## 2019-08-20 LAB — POTASSIUM: Potassium: 3.3 mmol/L — ABNORMAL LOW (ref 3.5–5.1)

## 2019-08-20 MED ORDER — LEVOFLOXACIN IN D5W 750 MG/150ML IV SOLN
750.0000 mg | INTRAVENOUS | Status: DC
  Administered 2019-08-20 – 2019-08-21 (×2): 750 mg via INTRAVENOUS
  Filled 2019-08-20 (×2): qty 150

## 2019-08-20 MED ORDER — SODIUM CHLORIDE 0.9% FLUSH
3.0000 mL | Freq: Two times a day (BID) | INTRAVENOUS | Status: DC
  Administered 2019-08-20 – 2019-08-21 (×3): 3 mL via INTRAVENOUS

## 2019-08-20 MED ORDER — POTASSIUM CHLORIDE CRYS ER 20 MEQ PO TBCR
40.0000 meq | EXTENDED_RELEASE_TABLET | Freq: Once | ORAL | Status: AC
  Administered 2019-08-20: 10:00:00 40 meq via ORAL
  Filled 2019-08-20: qty 2

## 2019-08-20 MED ORDER — SODIUM CHLORIDE 0.9% FLUSH
3.0000 mL | Freq: Two times a day (BID) | INTRAVENOUS | Status: DC
  Administered 2019-08-20 – 2019-08-21 (×3): 3 mL via INTRAVENOUS

## 2019-08-20 MED ORDER — IBUPROFEN 400 MG PO TABS
400.0000 mg | ORAL_TABLET | Freq: Four times a day (QID) | ORAL | Status: DC | PRN
  Administered 2019-08-20 – 2019-08-21 (×2): 400 mg via ORAL
  Filled 2019-08-20 (×2): qty 1

## 2019-08-20 MED ORDER — SODIUM CHLORIDE 0.9% FLUSH: 3.0000 mL | INTRAVENOUS | Status: DC | PRN

## 2019-08-20 NOTE — Progress Notes (Signed)
Ch received an OR regarding pt request for prayer. Ch spoke with pt via telephone as the pt shared that he hopes to be d/c tomorrow as he stated he was feeling better. Ch provided words of encouragement and offered a prayer for healing.  No further needs at this time.    08/20/19 1100  Clinical Encounter Type  Visited With Patient  Visit Type Spiritual support;Social support  Referral From Physician  Consult/Referral To Chaplain  Spiritual Encounters  Spiritual Needs Prayer;Emotional;Grief support  Stress Factors  Patient Stress Factors Health changes  Family Stress Factors None identified

## 2019-08-20 NOTE — Progress Notes (Addendum)
[facility] [place] NAME: Kishaun Naves    MR#:  [id number]  DATE OF BIRTH:  12/19/1951  SUBJECTIVE:  CHIEF COMPLAINT:   Chief Complaint  Patient presents with  . Cough  . Fever  Patient seen and evaluated today Patient still had 2 episodes of fever Has cough productive of phlegm Difficulty breathing improved Weaned off oxygen CT chest reviewed shows left lung pneumonia  REVIEW OF SYSTEMS:    ROS  CONSTITUTIONAL: No documented fever. Has fatigue, weakness. No weight gain, no weight loss.  EYES: No blurry or double vision.  ENT: No tinnitus. No postnasal drip. No redness of the oropharynx.  RESPIRATORY: Has cough, no wheeze, no hemoptysis. Has mild dyspnea.  CARDIOVASCULAR: No chest pain. No orthopnea. No palpitations. No syncope.  GASTROINTESTINAL: No nausea, no vomiting or diarrhea. No abdominal pain. No melena or hematochezia.  GENITOURINARY: No dysuria or hematuria.  ENDOCRINE: No polyuria or nocturia. No heat or cold intolerance.  HEMATOLOGY: No anemia. No bruising. No bleeding.  INTEGUMENTARY: No rashes. No lesions.  MUSCULOSKELETAL: No arthritis. No swelling. No gout.  NEUROLOGIC: No numbness, tingling, or ataxia. No seizure-type activity.  PSYCHIATRIC: No anxiety. No insomnia. No ADD.   DRUG ALLERGIES:  No Known Allergies  VITALS:  Blood pressure (!) 154/61, pulse 87, temperature 98.5 F (36.9 C), temperature source Oral, resp. rate 18, height 5\' 11"  (1.803 m), weight 85.5 kg, SpO2 95 %.  PHYSICAL EXAMINATION:   Physical Exam  GENERAL:  68 y.o.-year-old patient lying in the bed with no acute distress.  EYES: Pupils equal, round, reactive to light and accommodation. No scleral icterus. Extraocular muscles intact.  HEENT: Head atraumatic, normocephalic. Oropharynx and nasopharynx clear.  NECK:  Supple, no jugular venous distention. No thyroid enlargement, no tenderness.  LUNGS: Improved breath sounds bilaterally,  left lung Rales heard. No use of accessory muscles of respiration.  CARDIOVASCULAR: S1, S2 tachycardia noted. No murmurs, rubs, or gallops.  ABDOMEN: Soft, nontender, nondistended. Bowel sounds present. No organomegaly or mass.  EXTREMITIES: No cyanosis, clubbing or edema b/l.    NEUROLOGIC: Cranial nerves II through XII are intact. No focal Motor or sensory deficits b/l.   PSYCHIATRIC: The patient is alert and oriented x 3.  SKIN: No obvious rash, lesion, or ulcer.   LABORATORY PANEL:   CBC Recent Labs  Lab 08/19/19 0550  WBC 10.0  HGB 12.9*  HCT 38.5*  PLT 279   ------------------------------------------------------------------------------------------------------------------ Chemistries  Recent Labs  Lab 08/17/19 2030  08/19/19 0550 08/20/19 0350  NA 128*   < > 136  --   K 3.6   < > 3.5 3.3*  CL 92*   < > 101  --   CO2 20*   < > 25  --   GLUCOSE 195*   < > 171*  --   BUN 12   < > 12  --   CREATININE 0.95   < > 0.78  --   CALCIUM 8.4*   < > 8.1*  --   AST 27  --   --   --   ALT 26  --   --   --   ALKPHOS 59  --   --   --   BILITOT 0.5  --   --   --    < > = values in this interval not displayed.   ------------------------------------------------------------------------------------------------------------------  Cardiac Enzymes No results for input(s): TROPONINI in the last 168 hours. ------------------------------------------------------------------------------------------------------------------  RADIOLOGY:  Ct Chest W Contrast  Result Date: 08/19/2019 CLINICAL DATA:  68 year old male with fever, cough and confusion EXAM: CT CHEST WITH CONTRAST TECHNIQUE: Multidetector CT imaging of the chest was performed during intravenous contrast administration. CONTRAST:  76mL OMNIPAQUE IOHEXOL 300 MG/ML  SOLN COMPARISON:  Chest x-ray 08/17/2019 FINDINGS: Cardiovascular: Conventional 3 vessel aortic arch. No evidence of aneurysm or dissection. Main pulmonary artery within  normal limits. Heart within normal limits in size. No pericardial effusion. Mediastinum/Nodes: Unremarkable CT appearance of the thyroid gland. Left AP window lymph node is enlarged at 1.4 cm in short axis. No soft tissue mediastinal mass. The thoracic esophagus is unremarkable. Lungs/Pleura: Combination of ground-glass and consolidative airspace opacity within a peribronchovascular distribution within the left upper lobe. Air bronchograms are present throughout the abnormality. The findings are highly consistent with bronchopneumonia. Small amount of focal linear scarring present in the periphery of the right lower lobe. No underlying emphysematous changes. No suspicious pulmonary mass or nodule. Upper Abdomen: 2 cm left adrenal nodule is nonspecific in appearance. However, comparison with prior imaging from September of 2015 demonstrates a normal appearing thyroid gland. Musculoskeletal: No acute fracture or aggressive appearing lytic or blastic osseous lesion. IMPRESSION: 1. CT findings are most consistent with left upper lobe bronchopneumonia given the clinical history. However, given the presence of left mediastinal lymphadenopathy and new adrenal nodularity compared to prior imaging from 2015, malignancy is difficult to exclude entirely. Recommend repeat CT scan of the chest with intravenous contrast in 4-6 weeks following an appropriate course of antibiotic therapy. 2. Prominent left AP window lymph node favored to be reactive. Metastatic lymphadenopathy is difficult to exclude. Follow-up recommended above. 3. New 2 cm left adrenal nodule compared to prior imaging from 2015. While this could represent interval development of an adenoma, metastatic disease is difficult to exclude. Electronically Signed   By: Jacqulynn Cadet M.D.   On: 08/19/2019 19:15     ASSESSMENT AND PLAN:  68 year old male patient with no significant past medical history currently under hospitalist service  -Sepsis secondary to  pneumonia Resolving IV fluids IV antibiotics Follow-up cultures Start IV Levaquin antibiotic Discontinue Rocephin and Zithromax  -Community-acquired pneumonia Improving slowly Start IV Levaquin antibiotic Discontinue Rocephin and Zithromax  -COVID-19 test Negative  -Acute hypokalemia Replace potassium aggressively  -DVT prophylaxis subcu Lovenox daily  All the records are reviewed and case discussed with Care Management/Social Worker. Management plans discussed with the patient, family and they are in agreement.  CODE STATUS: Full code  DVT Prophylaxis: SCDs  TOTAL TIME TAKING CARE OF THIS PATIENT: 34 minutes.   POSSIBLE D/C IN 2 to 3 DAYS, DEPENDING ON CLINICAL CONDITION.  Saundra Shelling M.D on 08/20/2019 at 11:58 AM  Between 7am to 6pm - Pager - [phone]  After 6pm go to [email or web address] - password [email or web address] [facility]  Office  [phone]  CC: Primary care physician; Patient, No Pcp Per  Note: This dictation was prepared with Dragon dictation along with smaller phrase technology. Any transcriptional errors that result from this process are unintentional.

## 2019-08-20 NOTE — Care Management Important Message (Signed)
Important Message  Patient Details  Name: Gary Whitehead MRN: [id number] Date of Birth: 11-14-1951   Medicare Important Message Given:  Yes     Dannette Barbara 08/20/2019, 1:41 PM

## 2019-08-21 LAB — POTASSIUM: Potassium: 3.6 mmol/L (ref 3.5–5.1)

## 2019-08-21 LAB — URINE CULTURE: Culture: NO GROWTH

## 2019-08-21 LAB — GLUCOSE, CAPILLARY: Glucose-Capillary: 176 mg/dL — ABNORMAL HIGH (ref 70–99)

## 2019-08-21 MED ORDER — LEVOFLOXACIN 500 MG PO TABS: 500.0000 mg | ORAL_TABLET | Freq: Every day | ORAL | 0 refills | Status: AC

## 2019-08-21 MED ORDER — GUAIFENESIN-DM 100-10 MG/5ML PO SYRP: 5.0000 mL | ORAL_SOLUTION | ORAL | 0 refills | Status: DC | PRN

## 2019-08-21 MED ORDER — BENZONATATE 200 MG PO CAPS: 200.0000 mg | ORAL_CAPSULE | Freq: Three times a day (TID) | ORAL | 0 refills | Status: DC | PRN

## 2019-08-21 NOTE — Discharge Summary (Signed)
[facility] [place] NAME: Gary Whitehead    MR#:  [id number]  DATE OF BIRTH:  1951-03-20  DATE OF ADMISSION:  08/17/2019 ADMITTING PHYSICIAN: Lance Coon, MD  DATE OF DISCHARGE: 08/21/2019  PRIMARY CARE PHYSICIAN: Patient, No Pcp Per   ADMISSION DIAGNOSIS:  Sepsis ([place]) [A41.9] Sepsis due to pneumonia ([place]) [J18.9, A41.9]  DISCHARGE DIAGNOSIS:  Principal Problem:   Sepsis ([place]) Active Problems:   CAP (community acquired pneumonia) Dyspnea  SECONDARY DIAGNOSIS:   Past Medical History:  Diagnosis Date  . Patient denies medical problems      ADMITTING HISTORY Gary Whitehead  is a 68 y.o. male who presents with chief complaint as above.  Patient presents the ED with a complaint of cough for the past few days, and fever and confusion today.  In the ED he is found to have pneumonia on x-ray imaging.  He does meet sepsis criteria with fever and leukocytosis.    HOSPITAL COURSE:  Patient was admitted to medical floor initially started on IV Rocephin and Zithromax antibiotics.  Patient was worked up with CT chest which showed pneumonia.  Cultures did not reveal any growth.  Patient was switched to Levaquin antibiotic during the hospitalization.  COVID-19 test was negative.  CT chest showed mediastinal adenopathy along with pneumonia and left abdominal nodule.  Outpatient CT scan recommended again in 3 to 4 months.  Patient shortness of breath resolved cough resolved.  Tolerated diet well.  CONSULTS OBTAINED:    DRUG ALLERGIES:  No Known Allergies  DISCHARGE MEDICATIONS:   Allergies as of 08/21/2019   No Known Allergies     Medication List    TAKE these medications   acetaminophen 325 MG tablet Commonly known as: TYLENOL Take 650 mg by mouth every 6 (six) hours as needed.   benzonatate 200 MG capsule Commonly known as: TESSALON Take 1 capsule (200 mg total) by mouth 3 (three) times daily as needed for cough.    guaiFENesin-dextromethorphan 100-10 MG/5ML syrup Commonly known as: ROBITUSSIN DM Take 5 mLs by mouth every 4 (four) hours as needed for cough.   levofloxacin 500 MG tablet Commonly known as: Levaquin Take 1 tablet (500 mg total) by mouth daily for 5 days.       Today  Patient seen today Shortness of breath improved Weaned off oxygen Tolerating diet well Hemodynamically VITAL SIGNS:  Blood pressure 141 / 76 mmHg, pulse rate 88/min, temperature 99.3 F, saturation 96% on room  I/O:    Intake/Output Summary (Last 24 hours) at 08/21/2019 1237 Last data filed at 08/21/2019 1122 Gross per 24 hour  Intake 110 ml  Output 200 ml  Net -90 ml    PHYSICAL EXAMINATION:  Physical Exam  GENERAL:  68 y.o.-year-old patient lying in the bed with no acute distress.  LUNGS: Normal breath sounds bilaterally, no wheezing, rales,rhonchi or crepitation. No use of accessory muscles of respiration.  CARDIOVASCULAR: S1, S2 normal. No murmurs, rubs, or gallops.  ABDOMEN: Soft, non-tender, non-distended. Bowel sounds present. No organomegaly or mass.  NEUROLOGIC: Moves all 4 extremities. PSYCHIATRIC: The patient is alert and oriented x 3.  SKIN: No obvious rash, lesion, or ulcer.   DATA REVIEW:   CBC Recent Labs  Lab 08/20/19 1238  WBC 11.2*  HGB 12.9*  HCT 38.5*  PLT 303    Chemistries  Recent Labs  Lab 08/17/19 2030  08/19/19 0550  08/21/19 0536  NA 128*   < > 136  --   --  K 3.6   < > 3.5   < > 3.6  CL 92*   < > 101  --   --   CO2 20*   < > 25  --   --   GLUCOSE 195*   < > 171*  --   --   BUN 12   < > 12  --   --   CREATININE 0.95   < > 0.78  --   --   CALCIUM 8.4*   < > 8.1*  --   --   AST 27  --   --   --   --   ALT 26  --   --   --   --   ALKPHOS 59  --   --   --   --   BILITOT 0.5  --   --   --   --    < > = values in this interval not displayed.    Cardiac Enzymes No results for input(s): TROPONINI in the last 168 hours.  Microbiology Results  Results for  orders placed or performed during the hospital encounter of 08/17/19  Culture, blood (Routine x 2)     Status: None (Preliminary result)   Collection Time: 08/17/19  8:29 PM   Specimen: BLOOD  Result Value Ref Range Status   Specimen Description   Final    BLOOD Blood Culture results may not be optimal due to an excessive volume of blood received in culture bottles   Special Requests   Final    BOTTLES DRAWN AEROBIC AND ANAEROBIC RIGHT ANTECUBITAL   Culture   Final    NO GROWTH 4 DAYS Performed at [facility], [address], [place], [place] [address]    Report Status PENDING  Incomplete  Culture, blood (Routine x 2)     Status: None (Preliminary result)   Collection Time: 08/17/19  8:30 PM   Specimen: BLOOD  Result Value Ref Range Status   Specimen Description   Final    BLOOD Blood Culture results may not be optimal due to an excessive volume of blood received in culture bottles   Special Requests   Final    BOTTLES DRAWN AEROBIC AND ANAEROBIC BLOOD LEFT FOREARM   Culture   Final    NO GROWTH 4 DAYS Performed at [facility], [address], [place],  [address]    Report Status PENDING  Incomplete  Novel Coronavirus, NAA (Hosp order, Send-out to Ref Lab; TAT 18-24 hrs     Status: None   Collection Time: 08/17/19  9:04 PM   Specimen: Nasopharyngeal Swab; Respiratory  Result Value Ref Range Status   SARS-CoV-2, NAA NOT DETECTED NOT DETECTED Final    Comment: (NOTE) This test was developed and its performance characteristics determined by [employer]. This test has not been FDA cleared or approved. This test has been authorized by FDA under an Emergency Use Authorization (EUA). This test is only authorized for the duration of time the declaration that circumstances exist justifying the authorization of the emergency use of in vitro diagnostic tests for detection of SARS-CoV-2 virus and/or diagnosis of COVID-19 infection under section  564(b)(1) of the Act, 21 U.S.C. KA:123727), unless the authorization is terminated or revoked sooner. When diagnostic testing is negative, the possibility of a false negative result should be considered in the context of a patient's recent exposures and the presence of clinical signs and symptoms consistent with COVID-19. An individual  without symptoms of COVID-19 and who is not shedding SARS-CoV-2 virus would expect to have a negative (not detected) result in this assay. Performed  At: [facility] [address] [place], [place] [id number] Rush Farmer MD [id number]    [place]  Final    Comment: Performed at [facility], [address] [place], [place] [address]  Urine Culture     Status: None   Collection Time: 08/20/19  4:55 AM   Specimen: Urine, Clean Catch  Result Value Ref Range Status   Specimen Description   Final    URINE, CLEAN CATCH Performed at [facility], [address], [place], [place] [address]    Special Requests   Final    NONE Performed at [facility], [address], [place], [place] [address]    Culture   Final    NO GROWTH Performed at [facility], [place] [address], [place],  [address]    Report Status 08/21/2019 FINAL  Final    RADIOLOGY:  Ct Chest W Contrast  Result Date: 08/19/2019 CLINICAL DATA:  68 year old male with fever, cough and confusion EXAM: CT CHEST WITH CONTRAST TECHNIQUE: Multidetector CT imaging of the chest was performed during intravenous contrast administration. CONTRAST:  62mL OMNIPAQUE IOHEXOL 300 MG/ML  SOLN COMPARISON:  Chest x-ray 08/17/2019 FINDINGS: Cardiovascular: Conventional 3 vessel aortic arch. No evidence of aneurysm or dissection. Main pulmonary artery within normal limits. Heart within normal limits in size. No pericardial effusion. Mediastinum/Nodes: Unremarkable CT appearance of the thyroid gland. Left AP window lymph node is  enlarged at 1.4 cm in short axis. No soft tissue mediastinal mass. The thoracic esophagus is unremarkable. Lungs/Pleura: Combination of ground-glass and consolidative airspace opacity within a peribronchovascular distribution within the left upper lobe. Air bronchograms are present throughout the abnormality. The findings are highly consistent with bronchopneumonia. Small amount of focal linear scarring present in the periphery of the right lower lobe. No underlying emphysematous changes. No suspicious pulmonary mass or nodule. Upper Abdomen: 2 cm left adrenal nodule is nonspecific in appearance. However, comparison with prior imaging from September of 2015 demonstrates a normal appearing thyroid gland. Musculoskeletal: No acute fracture or aggressive appearing lytic or blastic osseous lesion. IMPRESSION: 1. CT findings are most consistent with left upper lobe bronchopneumonia given the clinical history. However, given the presence of left mediastinal lymphadenopathy and new adrenal nodularity compared to prior imaging from 2015, malignancy is difficult to exclude entirely. Recommend repeat CT scan of the chest with intravenous contrast in 4-6 weeks following an appropriate course of antibiotic therapy. 2. Prominent left AP window lymph node favored to be reactive. Metastatic lymphadenopathy is difficult to exclude. Follow-up recommended above. 3. New 2 cm left adrenal nodule compared to prior imaging from 2015. While this could represent interval development of an adenoma, metastatic disease is difficult to exclude. Electronically Signed   By: Jacqulynn Cadet M.D.   On: 08/19/2019 19:15    Follow up with PCP in 1 week.  Management plans discussed with the patient, family and they are in agreement.  CODE STATUS: Full code    Code Status Orders  (From admission, onward)         Start     Ordered   08/18/19 0154  Full code  Continuous     08/18/19 0153        Code Status History    This patient  has a current code status but no historical code status.  Advance Care Planning Activity    Advance Directive Documentation     Most Recent Value  Type of Advance Directive  Living will  Pre-existing out of facility DNR order (yellow form or pink MOST form)  -  "MOST" Form in Place?  -      TOTAL TIME TAKING CARE OF THIS PATIENT ON DAY OF DISCHARGE: more than 34 minutes.   Saundra Shelling M.D on 08/21/2019 at 12:37 PM  Between 7am to 6pm - Pager - [phone]  After 6pm go to [email or web address] - password [email or web address] [facility]  Office  [phone]  CC: Primary care physician; Patient, No Pcp Per  Note: This dictation was prepared with Dragon dictation along with smaller phrase technology. Any transcriptional errors that result from this process are unintentional.

## 2019-08-21 NOTE — Plan of Care (Signed)
  Problem: Clinical Measurements: Goal: Ability to maintain clinical measurements within normal limits will improve Outcome: Progressing Goal: Respiratory complications will improve Outcome: Progressing Goal: Cardiovascular complication will be avoided Outcome: Progressing   Problem: Respiratory: Goal: Ability to maintain a clear airway will improve Outcome: Progressing

## 2019-08-21 NOTE — Progress Notes (Signed)
Discharged to home with his wife.  Prescriptions sent to [facility]. He will make his follow up appointments

## 2019-08-22 LAB — CULTURE, BLOOD (ROUTINE X 2)
Culture: NO GROWTH
Culture: NO GROWTH

## 2019-08-28 ENCOUNTER — Other Ambulatory Visit: Payer: Self-pay | Admitting: Infectious Diseases

## 2019-08-28 DIAGNOSIS — R590 Localized enlarged lymph nodes: Secondary | ICD-10-CM

## 2019-08-28 DIAGNOSIS — J189 Pneumonia, unspecified organism: Secondary | ICD-10-CM

## 2019-08-28 DIAGNOSIS — E278 Other specified disorders of adrenal gland: Secondary | ICD-10-CM

## 2019-09-14 DIAGNOSIS — E119 Type 2 diabetes mellitus without complications: Secondary | ICD-10-CM | POA: Insufficient documentation

## 2019-09-15 ENCOUNTER — Other Ambulatory Visit: Payer: Self-pay | Admitting: Infectious Diseases

## 2019-09-15 DIAGNOSIS — E119 Type 2 diabetes mellitus without complications: Secondary | ICD-10-CM

## 2019-09-15 DIAGNOSIS — Z136 Encounter for screening for cardiovascular disorders: Secondary | ICD-10-CM

## 2019-09-15 DIAGNOSIS — Z720 Tobacco use: Secondary | ICD-10-CM

## 2019-09-25 ENCOUNTER — Other Ambulatory Visit: Payer: Self-pay

## 2019-09-25 ENCOUNTER — Ambulatory Visit
Admission: RE | Admit: 2019-09-25 | Discharge: 2019-09-25 | Disposition: A | Discharge status: Home / Self Care | Payer: Medicare Other | Source: Ambulatory Visit | Attending: Infectious Diseases | Admitting: Infectious Diseases

## 2019-09-25 ENCOUNTER — Ambulatory Visit: Payer: MEDICARE

## 2019-09-25 DIAGNOSIS — R590 Localized enlarged lymph nodes: Secondary | ICD-10-CM | POA: Diagnosis present

## 2019-09-25 DIAGNOSIS — I2510 Atherosclerotic heart disease of native coronary artery without angina pectoris: Secondary | ICD-10-CM | POA: Insufficient documentation

## 2019-09-25 DIAGNOSIS — J189 Pneumonia, unspecified organism: Secondary | ICD-10-CM | POA: Diagnosis not present

## 2019-09-25 DIAGNOSIS — R918 Other nonspecific abnormal finding of lung field: Secondary | ICD-10-CM | POA: Insufficient documentation

## 2019-09-25 DIAGNOSIS — F17210 Nicotine dependence, cigarettes, uncomplicated: Secondary | ICD-10-CM | POA: Insufficient documentation

## 2019-09-25 DIAGNOSIS — Z136 Encounter for screening for cardiovascular disorders: Secondary | ICD-10-CM | POA: Diagnosis not present

## 2019-09-25 DIAGNOSIS — E119 Type 2 diabetes mellitus without complications: Secondary | ICD-10-CM | POA: Insufficient documentation

## 2019-09-25 DIAGNOSIS — Z720 Tobacco use: Secondary | ICD-10-CM

## 2019-09-25 DIAGNOSIS — E278 Other specified disorders of adrenal gland: Secondary | ICD-10-CM | POA: Diagnosis present

## 2019-09-25 DIAGNOSIS — K760 Fatty (change of) liver, not elsewhere classified: Secondary | ICD-10-CM | POA: Insufficient documentation

## 2019-09-25 MED ORDER — IOHEXOL 300 MG/ML  SOLN
75.0000 mL | Freq: Once | INTRAMUSCULAR | Status: AC | PRN
  Administered 2019-09-25: 75 mL via INTRAVENOUS

## 2019-09-28 ENCOUNTER — Ambulatory Visit: Payer: MEDICARE

## 2019-09-29 DIAGNOSIS — D3502 Benign neoplasm of left adrenal gland: Secondary | ICD-10-CM | POA: Insufficient documentation

## 2019-09-29 DIAGNOSIS — R918 Other nonspecific abnormal finding of lung field: Secondary | ICD-10-CM | POA: Insufficient documentation

## 2020-02-25 ENCOUNTER — Other Ambulatory Visit: Payer: Self-pay | Admitting: Oncology

## 2020-02-25 DIAGNOSIS — R911 Solitary pulmonary nodule: Secondary | ICD-10-CM

## 2020-02-25 NOTE — Progress Notes (Signed)
  Pulmonary Nodule Clinic Telephone Note [place]   Received referral from Dr. Ola Spurr (PCP).  HPI: Patient was evaluated back in August 2020 for cough and fever with some confusion.  Imaging revealed pneumonia.  He met sepsis criteria and was treated accordingly.  CT chest incidentally also noted tiny bilateral pulmonary nodules measuring 2 mm in the right upper lobe and 2 mm in the left lower lobe.  He was also found to have right adrenal thickening with a 1.5 cm nodule.  Review and Recommendations: I personally reviewed all patient's previous imaging including CT chest from 09/25/2019.  I recommend follow-up with noncontrasted CT scan of the chest in approximately 9 months from previous.  Social History: Patient is a former smoker.  He quit smoking in August 2020.  He smoked 2 packs of cigarettes per day for approximately 20 years.   High risk factors include: History of heavy smoking, exposure to asbestos, radium or uranium, personal family history of lung cancer, older age, sex (females greater than males), race (black and native [place] greater than weight), marginal speculation, upper lobe location, multiplicity (less than 5 nodules increases risk for malignancy) and emphysema and/or pulmonary fibrosis.   This recommendation follows the consensus statement: Guidelines for Management of Incidental Pulmonary Nodules Detected on CT Images: From the Fleischner Society 2017; Radiology 2017; 284:228-243.    I have placed order for CT scan without contrast to be completed approximately 9 months from previous.  Disposition: Order placed for repeat CT chest. Will notify [place] in scheduling. [place] to call patient with appointment date and time. Return to pulmonary nodule clinic a few days after his repeat imaging to discuss results and plan moving forward.  Faythe Casa, NP 02/25/2020 10:36 AM

## 2020-06-01 ENCOUNTER — Telehealth: Payer: Self-pay | Admitting: *Deleted

## 2020-06-01 NOTE — Telephone Encounter (Signed)
Pt made aware of referral to Lung Nodule Clinic from PCP. Pt is in agreement to have upcoming CT scan and follow up as recommended. Requested to have call back in July to confirm appts for October. States that he has a busy work schedule at this time and could not confirm appts.   Will call back in July to follow up and confirm appts.

## 2020-07-25 ENCOUNTER — Telehealth: Payer: Self-pay | Admitting: *Deleted

## 2020-07-25 NOTE — Telephone Encounter (Signed)
Contacted pt to confirm appts for the lung nodule clinic. Instructed pt to call back to review/confirm appts.

## 2020-09-06 ENCOUNTER — Telehealth: Payer: Self-pay | Admitting: *Deleted

## 2020-09-06 NOTE — Telephone Encounter (Signed)
Unable to contact pt by phone to discuss referral and upcoming appts in the lung nodule clinic. Appts mailed to patient with contact info to call back to further discuss if needed.

## 2020-09-26 ENCOUNTER — Other Ambulatory Visit: Payer: Medicare Other

## 2020-09-27 ENCOUNTER — Ambulatory Visit: Payer: Medicare Other | Admitting: Oncology

## 2020-09-29 ENCOUNTER — Ambulatory Visit: Payer: Medicare Other | Admitting: Oncology

## 2020-10-31 ENCOUNTER — Ambulatory Visit (INDEPENDENT_AMBULATORY_CARE_PROVIDER_SITE_OTHER): Payer: Medicare Other | Admitting: Dermatology

## 2020-10-31 ENCOUNTER — Other Ambulatory Visit: Payer: Self-pay

## 2020-10-31 DIAGNOSIS — D485 Neoplasm of uncertain behavior of skin: Secondary | ICD-10-CM | POA: Diagnosis not present

## 2020-10-31 DIAGNOSIS — L570 Actinic keratosis: Secondary | ICD-10-CM

## 2020-10-31 DIAGNOSIS — L821 Other seborrheic keratosis: Secondary | ICD-10-CM

## 2020-10-31 DIAGNOSIS — Z1283 Encounter for screening for malignant neoplasm of skin: Secondary | ICD-10-CM | POA: Diagnosis not present

## 2020-10-31 DIAGNOSIS — D1800 Hemangioma unspecified site: Secondary | ICD-10-CM

## 2020-10-31 DIAGNOSIS — L918 Other hypertrophic disorders of the skin: Secondary | ICD-10-CM

## 2020-10-31 DIAGNOSIS — D229 Melanocytic nevi, unspecified: Secondary | ICD-10-CM

## 2020-10-31 DIAGNOSIS — L814 Other melanin hyperpigmentation: Secondary | ICD-10-CM | POA: Diagnosis not present

## 2020-10-31 DIAGNOSIS — L578 Other skin changes due to chronic exposure to nonionizing radiation: Secondary | ICD-10-CM

## 2020-10-31 NOTE — Patient Instructions (Signed)
Melanoma ABCDEs  Melanoma is the most dangerous type of skin cancer, and is the leading cause of death from skin disease.  You are more likely to develop melanoma if you:  Have light-colored skin, light-colored eyes, or red or blond hair  Spend a lot of time in the sun  Tan regularly, either outdoors or in a tanning bed  Have had blistering sunburns, especially during childhood  Have a close family member who has had a melanoma  Have atypical moles or large birthmarks  Early detection of melanoma is key since treatment is typically straightforward and cure rates are extremely high if we catch it early.   The first sign of melanoma is often a change in a mole or a new dark spot.  The ABCDE system is a way of remembering the signs of melanoma.  A for asymmetry:  The two halves do not match. B for border:  The edges of the growth are irregular. C for color:  A mixture of colors are present instead of an even brown color. D for diameter:  Melanomas are usually (but not always) greater than 50mm - the size of a pencil eraser. E for evolution:  The spot keeps changing in size, shape, and color.  Please check your skin once per month between visits. You can use a small mirror in front and a large mirror behind you to keep an eye on the back side or your body.   If you see any new or changing lesions before your next follow-up, please call to schedule a visit.  Please continue daily skin protection including broad spectrum sunscreen SPF 30+ to sun-exposed areas, reapplying every 2 hours as needed when you're outdoors.   Wound Care Instructions  1. Cleanse wound gently with soap and water once a day then pat dry with clean gauze. Apply a thing coat of Petrolatum (petroleum jelly, "Vaseline") over the wound (unless you have an allergy to this). We recommend that you use a new, sterile tube of Vaseline. Do not pick or remove scabs. Do not remove the yellow or white "healing tissue" from the  base of the wound.  2. Cover the wound with fresh, clean, nonstick gauze and secure with paper tape. You may use Band-Aids in place of gauze and tape if the would is small enough, but would recommend trimming much of the tape off as there is often too much. Sometimes Band-Aids can irritate the skin.  3. You should call the office for your biopsy report after 1 week if you have not already been contacted.  4. If you experience any problems, such as abnormal amounts of bleeding, swelling, significant bruising, significant pain, or evidence of infection, please call the office immediately.  5. FOR ADULT SURGERY PATIENTS: If you need something for pain relief you may take 1 extra strength Tylenol (acetaminophen) AND 2 Ibuprofen (200mg  each) together every 4 hours as needed for pain. (do not take these if you are allergic to them or if you have a reason you should not take them.) Typically, you may only need pain medication for 1 to 3 days.    Cryotherapy Aftercare  . Wash gently with soap and water everyday.   Marland Kitchen Apply Vaseline and Band-Aid daily until healed.

## 2020-10-31 NOTE — Progress Notes (Signed)
Follow-Up Visit   Subjective  Gary Whitehead is a 69 y.o. male who presents for the following: Annual Exam (Patient with no history of skin cancer. He does have a spot at left neck that has been there for less than a year but is irritated. ). The patient presents for Total-Body Skin Exam (TBSE) for skin cancer screening and mole check.  The following portions of the chart were reviewed this encounter and updated as appropriate:  Tobacco  Allergies  Meds  Problems  Med Hx  Surg Hx  Fam Hx     Review of Systems:  No other skin or systemic complaints except as noted in HPI or Assessment and Plan.  Objective  Well appearing patient in no apparent distress; mood and affect are within normal limits.  A full examination was performed including scalp, head, eyes, ears, nose, lips, neck, chest, axillae, abdomen, back, buttocks, bilateral upper extremities, bilateral lower extremities, hands, feet, fingers, toes, fingernails, and toenails. All findings within normal limits unless otherwise noted below.  Objective  Left lateral neck: Cutaneous horn 0.6cm  Objective  Right Forehead, face x 4 (4): Erythematous thin papules/macules with gritty scale.    Assessment & Plan  Neoplasm of uncertain behavior of skin Left lateral neck  Epidermal / dermal shaving  Lesion diameter (cm):  0.6 Informed consent: discussed and consent obtained   Timeout: patient name, date of birth, surgical site, and procedure verified   Procedure prep:  Patient was prepped and draped in usual sterile fashion Prep type:  Isopropyl alcohol Anesthesia: the lesion was anesthetized in a standard fashion   Anesthetic:  1% lidocaine w/ epinephrine 1-100,000 buffered w/ 8.4% NaHCO3 Instrument used: flexible razor blade   Hemostasis achieved with: pressure, aluminum chloride and electrodesiccation   Outcome: patient tolerated procedure well   Post-procedure details: sterile dressing applied and wound care  instructions given   Dressing type: bandage and petrolatum    Destruction of lesion Complexity: extensive   Destruction method: electrodesiccation and curettage   Informed consent: discussed and consent obtained   Timeout:  patient name, date of birth, surgical site, and procedure verified Procedure prep:  Patient was prepped and draped in usual sterile fashion Prep type:  Isopropyl alcohol Anesthesia: the lesion was anesthetized in a standard fashion   Anesthetic:  1% lidocaine w/ epinephrine 1-100,000 buffered w/ 8.4% NaHCO3 Curettage performed in three different directions: Yes   Electrodesiccation performed over the curetted area: Yes   Lesion length (cm):  0.6 Lesion width (cm):  0.6 Margin per side (cm):  0.2 Final wound size (cm):  1 Hemostasis achieved with:  pressure, aluminum chloride and electrodesiccation Outcome: patient tolerated procedure well with no complications   Post-procedure details: sterile dressing applied and wound care instructions given   Dressing type: bandage and petrolatum    Specimen 1 - Surgical pathology Differential Diagnosis: Cutaneous Horn = wart vs AK vs ISK vs SCC Check Margins: No Cutaneous horn 0.6cm  AK (actinic keratosis) (4) Right Forehead, face x 4  Destruction of lesion - Right Forehead, face x 4 Complexity: simple   Destruction method: cryotherapy   Informed consent: discussed and consent obtained   Timeout:  patient name, date of birth, surgical site, and procedure verified Lesion destroyed using liquid nitrogen: Yes   Region frozen until ice ball extended beyond lesion: Yes   Outcome: patient tolerated procedure well with no complications   Post-procedure details: wound care instructions given    Skin cancer screening  Lentigines - Scattered tan macules - Discussed due to sun exposure - Benign, observe - Call for any changes  Seborrheic Keratoses - Stuck-on, waxy, tan-brown papules and plaques  - Discussed benign  etiology and prognosis. - Observe - Call for any changes  Melanocytic Nevi - Tan-brown and/or pink-flesh-colored symmetric macules and papules - Benign appearing on exam today - Observation - Call clinic for new or changing moles - Recommend daily use of broad spectrum spf 30+ sunscreen to sun-exposed areas.   Hemangiomas - Red papules - Discussed benign nature - Observe - Call for any changes  Actinic Damage - Chronic, secondary to cumulative UV/sun exposure - diffuse scaly erythematous macules with underlying dyspigmentation - Recommend daily broad spectrum sunscreen SPF 30+ to sun-exposed areas, reapply every 2 hours as needed.  - Call for new or changing lesions.  Skin cancer screening performed today.  Acrochordons (Skin Tags) - Fleshy, skin-colored pedunculated papules - Benign appearing.  - Observe. - If desired, they can be removed with an in office procedure that is not covered by insurance. - Please call the clinic if you notice any new or changing lesions.   Return in about 1 year (around 10/31/2021) for TBSE.  Graciella Belton, RMA, am acting as scribe for Sarina Ser, MD . Documentation: I have reviewed the above documentation for accuracy and completeness, and I agree with the above.  Sarina Ser, MD

## 2020-11-01 ENCOUNTER — Encounter: Payer: Self-pay | Admitting: Dermatology

## 2020-11-02 ENCOUNTER — Telehealth: Payer: Self-pay

## 2020-11-02 NOTE — Telephone Encounter (Signed)
Patient informed of pathology results 

## 2020-11-02 NOTE — Telephone Encounter (Signed)
-----   Message from Ralene Bathe, MD sent at 11/01/2020  7:09 PM EST ----- Diagnosis Skin , left lateral neck VERRUCA VULGARIS, IRRITATED  Benign viral wart Already treated May recur

## 2020-11-19 ENCOUNTER — Other Ambulatory Visit: Payer: Self-pay

## 2020-11-19 ENCOUNTER — Emergency Department (HOSPITAL_COMMUNITY)
Admission: EM | Admit: 2020-11-19 | Discharge: 2020-11-19 | Disposition: A | Discharge status: Home / Self Care | Payer: Medicare Other | Attending: Emergency Medicine | Admitting: Emergency Medicine

## 2020-11-19 ENCOUNTER — Encounter (HOSPITAL_COMMUNITY): Payer: Self-pay | Admitting: Emergency Medicine

## 2020-11-19 ENCOUNTER — Emergency Department (HOSPITAL_COMMUNITY): Payer: Medicare Other

## 2020-11-19 DIAGNOSIS — Z87891 Personal history of nicotine dependence: Secondary | ICD-10-CM | POA: Insufficient documentation

## 2020-11-19 DIAGNOSIS — W19XXXA Unspecified fall, initial encounter: Secondary | ICD-10-CM

## 2020-11-19 DIAGNOSIS — S52502A Unspecified fracture of the lower end of left radius, initial encounter for closed fracture: Secondary | ICD-10-CM

## 2020-11-19 DIAGNOSIS — M25532 Pain in left wrist: Secondary | ICD-10-CM | POA: Diagnosis present

## 2020-11-19 DIAGNOSIS — Y9289 Other specified places as the place of occurrence of the external cause: Secondary | ICD-10-CM | POA: Diagnosis not present

## 2020-11-19 DIAGNOSIS — W11XXXA Fall on and from ladder, initial encounter: Secondary | ICD-10-CM | POA: Insufficient documentation

## 2020-11-19 MED ORDER — OXYCODONE-ACETAMINOPHEN 5-325 MG PO TABS
1.0000 | ORAL_TABLET | ORAL | 0 refills | Status: DC | PRN
Start: 2020-11-19 — End: 2020-11-24

## 2020-11-19 MED ORDER — BUPIVACAINE HCL (PF) 0.5 % IJ SOLN
30.0000 mL | Freq: Once | INTRAMUSCULAR | Status: AC
  Administered 2020-11-19: 30 mL
  Filled 2020-11-19: qty 30

## 2020-11-19 MED ORDER — BUPIVACAINE HCL 0.5 % IJ SOLN: 50.0000 mL | Freq: Once | INTRAMUSCULAR | Status: DC

## 2020-11-19 MED ORDER — HYDROMORPHONE HCL 1 MG/ML IJ SOLN
1.0000 mg | Freq: Once | INTRAMUSCULAR | Status: AC
  Administered 2020-11-19: 1 mg via INTRAVENOUS
  Filled 2020-11-19: qty 1

## 2020-11-19 MED ORDER — FENTANYL CITRATE (PF) 100 MCG/2ML IJ SOLN
50.0000 ug | INTRAMUSCULAR | Status: DC | PRN
  Administered 2020-11-19 (×2): 50 ug via INTRAVENOUS
  Filled 2020-11-19 (×2): qty 2

## 2020-11-19 NOTE — ED Triage Notes (Signed)
PT BIB [facility] for fall 4-5 from ladder.  Pt missed step, fell to ground, braced himself with left wrist.  EMS noted S-shaped deformity to left wrist. No LOC with fall. Family reported 2 syncopal episodes and loss of bowels at home "from pain" but was A&Ox4 for EMS.

## 2020-11-19 NOTE — Discharge Instructions (Addendum)
Return if any problems.

## 2020-11-19 NOTE — ED Provider Notes (Signed)
[facility] EMERGENCY DEPARTMENT Provider Note   CSN: [id number] Arrival date & time: 11/19/20  1726     History Chief Complaint  Patient presents with  . Fall    Gary Whitehead is a 69 y.o. male.  Pt complains of left wrist pain.  Pt reports he fell from a ladder.  Pt reports the caught himself with left wrist.  Pt complains of deformity and pain  The history is provided by the patient. No language interpreter was used.  Fall This is a new problem. The current episode started less than 1 hour ago. The problem occurs constantly. The problem has not changed since onset.Pertinent negatives include no chest pain, no abdominal pain and no headaches. Nothing aggravates the symptoms. Nothing relieves the symptoms. He has tried nothing for the symptoms. The treatment provided no relief.       Past Medical History:  Diagnosis Date  . Patient denies medical problems     Patient Active Problem List   Diagnosis Date Noted  . Sepsis ([place]) 08/17/2019  . CAP (community acquired pneumonia) 08/17/2019    Past Surgical History:  Procedure Laterality Date  . NO PAST SURGERIES         History reviewed. No pertinent family history.  Social History   Tobacco Use  . Smoking status: Former Smoker    Packs/day: 1.50    Types: Cigarettes    Quit date: 06/24/2019    Years since quitting: 1.4  . Smokeless tobacco: Never Used  . Tobacco comment: 1 1/5 ppd  Vaping Use  . Vaping Use: Never used  Substance Use Topics  . Alcohol use: No  . Drug use: No    Home Medications Prior to Admission medications   Medication Sig Start Date End Date Taking? Authorizing Provider  acetaminophen (TYLENOL) 325 MG tablet Take 650 mg by mouth every 6 (six) hours as needed.    [provider]  benzonatate (TESSALON) 200 MG capsule Take 1 capsule (200 mg total) by mouth 3 (three) times daily as needed for cough. 08/21/19   Pyreddy, Reatha Harps, MD  guaiFENesin-dextromethorphan  (ROBITUSSIN DM) 100-10 MG/5ML syrup Take 5 mLs by mouth every 4 (four) hours as needed for cough. 08/21/19   Saundra Shelling, MD    Allergies    Patient has no known allergies.  Review of Systems   Review of Systems  Cardiovascular: Negative for chest pain.  Gastrointestinal: Negative for abdominal pain.  Neurological: Negative for headaches.  All other systems reviewed and are negative.   Physical Exam Updated Vital Signs BP (!) 150/69 (BP Location: Right Arm)   Pulse 91   Temp 98.3 F (36.8 C) (Oral)   Resp 16   Ht 5\' 11"  (1.803 m)   Wt 85.7 kg   SpO2 98%   BMI 26.36 kg/m   Physical Exam Vitals and nursing note reviewed.  Constitutional:      Appearance: He is well-developed.  HENT:     Head: Normocephalic and atraumatic.  Eyes:     Conjunctiva/sclera: Conjunctivae normal.  Cardiovascular:     Rate and Rhythm: Normal rate and regular rhythm.     Heart sounds: No murmur heard.   Pulmonary:     Effort: Pulmonary effort is normal. No respiratory distress.     Breath sounds: Normal breath sounds.  Abdominal:     Palpations: Abdomen is soft.     Tenderness: There is no abdominal tenderness.  Musculoskeletal:  General: Swelling, tenderness and deformity present.     Cervical back: Neck supple.     Comments: Left wrist deformity  nv and ns intact  Skin:    General: Skin is warm and dry.  Neurological:     General: No focal deficit present.     Mental Status: He is alert.  Psychiatric:        Mood and Affect: Mood normal.     ED Results / Procedures / Treatments   Labs (all labs ordered are listed, but only abnormal results are displayed) Labs Reviewed - No data to display  EKG None  Radiology No results found.  Procedures .Ortho Injury Treatment  Date/Time: 11/19/2020 10:54 PM Performed by: Fransico Meadow, PA-C Authorized by: Fransico Meadow, PA-C   Consent:    Consent obtained:  Verbal   Consent given by:  Patient   Alternatives  discussed:  No treatmentInjury location: wrist Location details: right wrist Pre-procedure neurovascular assessment: neurovascularly intact Pre-procedure distal perfusion: normal Pre-procedure neurological function: normal Anesthesia: hematoma block  Anesthesia: Local anesthesia used: yes Local Anesthetic: bupivacaine 0.5% without epinephrine Anesthetic total: 10 mL  Patient sedated: NoSplint type: sugar tong Supplies used: Ortho-Glass Post-procedure neurovascular assessment: post-procedure neurovascularly intact Post-procedure distal perfusion: normal Post-procedure neurological function: normal Post-procedure range of motion: normal Patient tolerance: patient tolerated the procedure well with no immediate complications Comments: Xray post reduction  Slight improvement    (including critical care time)  Medications Ordered in ED Medications  fentaNYL (SUBLIMAZE) injection 50 mcg (50 mcg Intravenous Given 11/19/20 1748)    ED Course  I have reviewed the triage vital signs and the nursing notes.  Pertinent labs & imaging results that were available during my care of the patient were reviewed by me and considered in my medical decision making (see chart for details).    MDM Rules/Calculators/A&P                          MDM:  I spoke to Dr. Jeannie Fend who advised try to reduce to better position.  Have pt follow up in the office on Tuesday.  Final Clinical Impression(s) / ED Diagnoses Final diagnoses:  Fall, initial encounter  Closed fracture of distal end of left radius, unspecified fracture morphology, initial encounter    Rx / DC Orders ED Discharge Orders         Ordered    oxyCODONE-acetaminophen (PERCOCET) 5-325 MG tablet  Every 4 hours PRN        11/19/20 2024        An After Visit Summary was printed and given to the patient.    Sidney Ace 11/19/20 2256    Truddie Hidden, MD 11/19/20 2329

## 2020-11-19 NOTE — Progress Notes (Signed)
Orthopedic Tech Progress Note Patient Details:  FILMORE MOLYNEUX 1951/02/27 [id number]  Ortho Devices Type of Ortho Device: Arm sling, Sugartong splint, Finger trap Finger Trap Weight: 10 lbs. Ortho Device/Splint Location: lue. 10 lbs finger traps applied at drs request. splint applied post reduction. Ortho Device/Splint Interventions: Ordered, Application, Adjustment   Post Interventions Patient Tolerated: Well Instructions Provided: Care of device, Adjustment of device   Karolee Stamps 11/19/2020, 8:34 PM

## 2020-11-19 NOTE — ED Notes (Signed)
Ortho tech called for splint placement. 

## 2020-11-22 ENCOUNTER — Encounter (HOSPITAL_BASED_OUTPATIENT_CLINIC_OR_DEPARTMENT_OTHER): Payer: Self-pay | Admitting: Orthopaedic Surgery

## 2020-11-22 ENCOUNTER — Other Ambulatory Visit: Payer: Self-pay

## 2020-11-23 ENCOUNTER — Other Ambulatory Visit (HOSPITAL_COMMUNITY)
Admission: RE | Admit: 2020-11-23 | Discharge: 2020-11-23 | Disposition: A | Discharge status: Home / Self Care | Payer: Medicare Other | Source: Ambulatory Visit | Attending: Orthopaedic Surgery | Admitting: Orthopaedic Surgery

## 2020-11-23 DIAGNOSIS — S52509A Unspecified fracture of the lower end of unspecified radius, initial encounter for closed fracture: Secondary | ICD-10-CM | POA: Insufficient documentation

## 2020-11-23 DIAGNOSIS — Z20822 Contact with and (suspected) exposure to covid-19: Secondary | ICD-10-CM | POA: Insufficient documentation

## 2020-11-23 DIAGNOSIS — Z01812 Encounter for preprocedural laboratory examination: Secondary | ICD-10-CM | POA: Insufficient documentation

## 2020-11-23 LAB — SARS CORONAVIRUS 2 (TAT 6-24 HRS): SARS Coronavirus 2: NEGATIVE

## 2020-11-24 ENCOUNTER — Encounter (HOSPITAL_BASED_OUTPATIENT_CLINIC_OR_DEPARTMENT_OTHER)
Admission: RE | Disposition: A | Discharge status: Home / Self Care | Payer: Self-pay | Source: Home / Self Care | Attending: Orthopaedic Surgery

## 2020-11-24 ENCOUNTER — Encounter (HOSPITAL_BASED_OUTPATIENT_CLINIC_OR_DEPARTMENT_OTHER): Payer: Self-pay | Admitting: Orthopaedic Surgery

## 2020-11-24 ENCOUNTER — Ambulatory Visit (HOSPITAL_BASED_OUTPATIENT_CLINIC_OR_DEPARTMENT_OTHER): Payer: Medicare Other | Admitting: Anesthesiology

## 2020-11-24 ENCOUNTER — Other Ambulatory Visit: Payer: Self-pay

## 2020-11-24 ENCOUNTER — Ambulatory Visit (HOSPITAL_BASED_OUTPATIENT_CLINIC_OR_DEPARTMENT_OTHER)
Admission: RE | Admit: 2020-11-24 | Discharge: 2020-11-24 | Disposition: A | Discharge status: Home / Self Care | Payer: Medicare Other | Attending: Orthopaedic Surgery | Admitting: Orthopaedic Surgery

## 2020-11-24 DIAGNOSIS — Z7982 Long term (current) use of aspirin: Secondary | ICD-10-CM | POA: Diagnosis not present

## 2020-11-24 DIAGNOSIS — Z79899 Other long term (current) drug therapy: Secondary | ICD-10-CM | POA: Diagnosis not present

## 2020-11-24 DIAGNOSIS — W11XXXA Fall on and from ladder, initial encounter: Secondary | ICD-10-CM | POA: Insufficient documentation

## 2020-11-24 DIAGNOSIS — S52572A Other intraarticular fracture of lower end of left radius, initial encounter for closed fracture: Secondary | ICD-10-CM | POA: Diagnosis not present

## 2020-11-24 DIAGNOSIS — F17210 Nicotine dependence, cigarettes, uncomplicated: Secondary | ICD-10-CM | POA: Diagnosis not present

## 2020-11-24 HISTORY — DX: Essential (primary) hypertension: I10

## 2020-11-24 HISTORY — DX: Unspecified fracture of the lower end of left radius, initial encounter for closed fracture: S52.502A

## 2020-11-24 HISTORY — DX: Unspecified osteoarthritis, unspecified site: M19.90

## 2020-11-24 HISTORY — PX: ORIF WRIST FRACTURE: SHX2133

## 2020-11-24 SURGERY — OPEN REDUCTION INTERNAL FIXATION (ORIF) WRIST FRACTURE
Anesthesia: Monitor Anesthesia Care | Site: Wrist | Laterality: Left

## 2020-11-24 MED ORDER — 0.9 % SODIUM CHLORIDE (POUR BTL) OPTIME
TOPICAL | Status: DC | PRN
  Administered 2020-11-24: 200 mL

## 2020-11-24 MED ORDER — CEFAZOLIN SODIUM-DEXTROSE 2-4 GM/100ML-% IV SOLN
2.0000 g | INTRAVENOUS | Status: AC
  Administered 2020-11-24: 2 g via INTRAVENOUS

## 2020-11-24 MED ORDER — CHLORHEXIDINE GLUCONATE 4 % EX LIQD: 60.0000 mL | Freq: Once | CUTANEOUS | Status: DC

## 2020-11-24 MED ORDER — OXYCODONE HCL 5 MG PO TABS: 5.0000 mg | ORAL_TABLET | Freq: Once | ORAL | Status: DC | PRN

## 2020-11-24 MED ORDER — OXYCODONE HCL 5 MG/5ML PO SOLN: 5.0000 mg | Freq: Once | ORAL | Status: DC | PRN

## 2020-11-24 MED ORDER — POVIDONE-IODINE 10 % EX SWAB
2.0000 "application " | Freq: Once | CUTANEOUS | Status: AC
  Administered 2020-11-24: 2 via TOPICAL

## 2020-11-24 MED ORDER — FENTANYL CITRATE (PF) 100 MCG/2ML IJ SOLN
INTRAMUSCULAR | Status: AC
  Filled 2020-11-24: qty 2

## 2020-11-24 MED ORDER — FENTANYL CITRATE (PF) 100 MCG/2ML IJ SOLN: 25.0000 ug | INTRAMUSCULAR | Status: DC | PRN

## 2020-11-24 MED ORDER — ONDANSETRON HCL 4 MG/2ML IJ SOLN: 4.0000 mg | Freq: Once | INTRAMUSCULAR | Status: DC | PRN

## 2020-11-24 MED ORDER — ONDANSETRON HCL 4 MG/2ML IJ SOLN
INTRAMUSCULAR | Status: DC | PRN
  Administered 2020-11-24: 4 mg via INTRAVENOUS

## 2020-11-24 MED ORDER — LIDOCAINE 2% (20 MG/ML) 5 ML SYRINGE
INTRAMUSCULAR | Status: DC | PRN
  Administered 2020-11-24: 30 mg via INTRAVENOUS

## 2020-11-24 MED ORDER — MIDAZOLAM HCL 2 MG/2ML IJ SOLN
INTRAMUSCULAR | Status: AC
  Filled 2020-11-24: qty 2

## 2020-11-24 MED ORDER — ACETAMINOPHEN 500 MG PO TABS: 1000.0000 mg | ORAL_TABLET | Freq: Once | ORAL | Status: DC

## 2020-11-24 MED ORDER — PROPOFOL 500 MG/50ML IV EMUL
INTRAVENOUS | Status: DC | PRN
  Administered 2020-11-24: 100 ug/kg/min via INTRAVENOUS

## 2020-11-24 MED ORDER — CEFAZOLIN SODIUM-DEXTROSE 2-4 GM/100ML-% IV SOLN
INTRAVENOUS | Status: AC
  Filled 2020-11-24: qty 100

## 2020-11-24 MED ORDER — OXYCODONE-ACETAMINOPHEN 5-325 MG PO TABS: 1.0000 | ORAL_TABLET | Freq: Four times a day (QID) | ORAL | 0 refills | Status: DC | PRN

## 2020-11-24 MED ORDER — LACTATED RINGERS IV SOLN: INTRAVENOUS | Status: DC

## 2020-11-24 MED ORDER — ROPIVACAINE HCL 5 MG/ML IJ SOLN
INTRAMUSCULAR | Status: DC | PRN
  Administered 2020-11-24: 30 mL via EPIDURAL

## 2020-11-24 MED ORDER — FENTANYL CITRATE (PF) 100 MCG/2ML IJ SOLN
100.0000 ug | Freq: Once | INTRAMUSCULAR | Status: AC
  Administered 2020-11-24: 50 ug via INTRAVENOUS

## 2020-11-24 MED ORDER — ACETAMINOPHEN 10 MG/ML IV SOLN
INTRAVENOUS | Status: AC
  Filled 2020-11-24: qty 100

## 2020-11-24 MED ORDER — MIDAZOLAM HCL 2 MG/2ML IJ SOLN
2.0000 mg | Freq: Once | INTRAMUSCULAR | Status: AC
  Administered 2020-11-24: 1 mg via INTRAVENOUS

## 2020-11-24 SURGICAL SUPPLY — 89 items
APL PRP STRL LF DISP 70% ISPRP (MISCELLANEOUS) ×1
BIT DRILL 2.0 LNG QUCK RELEASE (BIT) ×1 IMPLANT
BIT DRILL QC 2.8X5 (BIT) ×2 IMPLANT
BLADE CLIPPER SURG (BLADE) IMPLANT
BLADE SURG 15 STRL LF DISP TIS (BLADE) ×2 IMPLANT
BLADE SURG 15 STRL SS (BLADE) ×4
BNDG CMPR 9X4 STRL LF SNTH (GAUZE/BANDAGES/DRESSINGS) ×1
BNDG CONFORM 2 STRL LF (GAUZE/BANDAGES/DRESSINGS) IMPLANT
BNDG ELASTIC 3X5.8 VLCR STR LF (GAUZE/BANDAGES/DRESSINGS) ×2 IMPLANT
BNDG ELASTIC 4X5.8 VLCR STR LF (GAUZE/BANDAGES/DRESSINGS) ×2 IMPLANT
BNDG ESMARK 4X9 LF (GAUZE/BANDAGES/DRESSINGS) ×2 IMPLANT
BNDG GAUZE ELAST 4 BULKY (GAUZE/BANDAGES/DRESSINGS) ×2 IMPLANT
BRUSH SCRUB EZ PLAIN DRY (MISCELLANEOUS) IMPLANT
CANISTER SUCT 1200ML W/VALVE (MISCELLANEOUS) ×2 IMPLANT
CHLORAPREP W/TINT 26 (MISCELLANEOUS) ×2 IMPLANT
CORD BIPOLAR FORCEPS 12FT (ELECTRODE) ×2 IMPLANT
COVER BACK TABLE 60X90IN (DRAPES) ×2 IMPLANT
COVER WAND RF STERILE (DRAPES) IMPLANT
CUFF TOURN SGL QUICK 18X4 (TOURNIQUET CUFF) ×2 IMPLANT
DECANTER SPIKE VIAL GLASS SM (MISCELLANEOUS) IMPLANT
DRAPE EXTREMITY T 121X128X90 (DISPOSABLE) ×2 IMPLANT
DRAPE OEC MINIVIEW 54X84 (DRAPES) ×2 IMPLANT
DRAPE SURG 17X23 STRL (DRAPES) ×2 IMPLANT
DRILL 2.0 LNG QUICK RELEASE (BIT) ×2
DRSG ADAPTIC 3X8 NADH LF (GAUZE/BANDAGES/DRESSINGS) IMPLANT
DRSG EMULSION OIL 3X3 NADH (GAUZE/BANDAGES/DRESSINGS) IMPLANT
GAUZE 4X4 16PLY RFD (DISPOSABLE) IMPLANT
GAUZE SPONGE 4X4 12PLY STRL (GAUZE/BANDAGES/DRESSINGS) ×2 IMPLANT
GAUZE XEROFORM 1X8 LF (GAUZE/BANDAGES/DRESSINGS) IMPLANT
GAUZE XEROFORM 5X9 LF (GAUZE/BANDAGES/DRESSINGS) IMPLANT
GLOVE BIOGEL PI IND STRL 8 (GLOVE) ×1 IMPLANT
GLOVE BIOGEL PI INDICATOR 8 (GLOVE) ×1
GLOVE SURG SS PI 7.0 STRL IVOR (GLOVE) ×6 IMPLANT
GLOVE SURG SYN 7.5  E (GLOVE)
GLOVE SURG SYN 7.5 E (GLOVE) IMPLANT
GLOVE SURG UNDER POLY LF SZ7 (GLOVE) ×6 IMPLANT
GOWN STRL REUS W/ TWL LRG LVL3 (GOWN DISPOSABLE) ×2 IMPLANT
GOWN STRL REUS W/ TWL XL LVL3 (GOWN DISPOSABLE) ×1 IMPLANT
GOWN STRL REUS W/TWL LRG LVL3 (GOWN DISPOSABLE) ×4
GOWN STRL REUS W/TWL XL LVL3 (GOWN DISPOSABLE) ×2
GUIDEWIRE ORTHO 0.054X6 (WIRE) ×12 IMPLANT
NEEDLE HYPO 22GX1.5 SAFETY (NEEDLE) IMPLANT
NEEDLE HYPO 25X1 1.5 SAFETY (NEEDLE) ×2 IMPLANT
NS IRRIG 1000ML POUR BTL (IV SOLUTION) ×2 IMPLANT
PACK BASIN DAY SURGERY FS (CUSTOM PROCEDURE TRAY) ×2 IMPLANT
PAD CAST 3X4 CTTN HI CHSV (CAST SUPPLIES) ×2 IMPLANT
PAD CAST 4YDX4 CTTN HI CHSV (CAST SUPPLIES) ×1 IMPLANT
PADDING CAST ABS 3INX4YD NS (CAST SUPPLIES) ×1
PADDING CAST ABS 4INX4YD NS (CAST SUPPLIES) ×1
PADDING CAST ABS COTTON 3X4 (CAST SUPPLIES) ×1 IMPLANT
PADDING CAST ABS COTTON 4X4 ST (CAST SUPPLIES) ×1 IMPLANT
PADDING CAST COTTON 3X4 STRL (CAST SUPPLIES) ×4
PADDING CAST COTTON 4X4 STRL (CAST SUPPLIES) ×2
PLATE DISTAL RADIUS L WIDE (Plate) ×2 IMPLANT
PUTTY DBM STAGRAFT 2CC (Putty) ×2 IMPLANT
SCREW BN FT 16X2.3XLCK HEX CRT (Screw) ×1 IMPLANT
SCREW CORT FT 18X2.3XLCK HEX (Screw) ×1 IMPLANT
SCREW CORT FT 20X2.3XLCK HEX (Screw) ×1 IMPLANT
SCREW CORT FT 22X2.3XLCK HEX (Screw) ×3 IMPLANT
SCREW CORT FT 24X2.3XLCK HEX (Screw) ×1 IMPLANT
SCREW CORTICAL LOCKING 2.3X16M (Screw) ×2 IMPLANT
SCREW CORTICAL LOCKING 2.3X18M (Screw) ×2 IMPLANT
SCREW CORTICAL LOCKING 2.3X20M (Screw) ×2 IMPLANT
SCREW CORTICAL LOCKING 2.3X22M (Screw) ×6 IMPLANT
SCREW CORTICAL LOCKING 2.3X24M (Screw) ×2 IMPLANT
SCREW HEXALOBE LOCKING 3.5X14M (Screw) ×4 IMPLANT
SCREW HEXALOBE NON-LOCK 3.5X14 (Screw) ×2 IMPLANT
SCREW LOCK 12X3.5X HEXALOBE (Screw) ×1 IMPLANT
SCREW LOCKING 3.5X12 (Screw) ×2 IMPLANT
SHEET MEDIUM DRAPE 40X70 STRL (DRAPES) ×2 IMPLANT
SLING ARM FOAM STRAP XLG (SOFTGOODS) ×2 IMPLANT
SPLINT FIBERGLASS 3X35 (CAST SUPPLIES) ×2 IMPLANT
SPLINT FIBERGLASS 4X30 (CAST SUPPLIES) IMPLANT
SPLINT PLASTER CAST XFAST 3X15 (CAST SUPPLIES) IMPLANT
SPLINT PLASTER XTRA FASTSET 3X (CAST SUPPLIES)
STOCKINETTE SYNTHETIC 3 UNSTER (CAST SUPPLIES) ×2 IMPLANT
SUCTION FRAZIER HANDLE 10FR (MISCELLANEOUS) ×1
SUCTION TUBE FRAZIER 10FR DISP (MISCELLANEOUS) ×1 IMPLANT
SUT ETHIBOND 3-0 V-5 (SUTURE) IMPLANT
SUT PROLENE 4 0 PS 2 18 (SUTURE) ×4 IMPLANT
SUT VIC AB 3-0 FS2 27 (SUTURE) ×2 IMPLANT
SYR BULB EAR ULCER 3OZ GRN STR (SYRINGE) ×2 IMPLANT
SYR CONTROL 10ML LL (SYRINGE) ×2 IMPLANT
SYSTEM CHEST DRAIN TLS 7FR (DRAIN) IMPLANT
TAPE SURG TRANSPORE 1 IN (GAUZE/BANDAGES/DRESSINGS) ×1 IMPLANT
TAPE SURGICAL TRANSPORE 1 IN (GAUZE/BANDAGES/DRESSINGS) ×1
TOWEL GREEN STERILE FF (TOWEL DISPOSABLE) ×4 IMPLANT
TUBE CONNECTING 20X1/4 (TUBING) ×2 IMPLANT
UNDERPAD 30X36 HEAVY ABSORB (UNDERPADS AND DIAPERS) ×2 IMPLANT

## 2020-11-24 NOTE — Transfer of Care (Signed)
Immediate Anesthesia Transfer of Care Note  Patient: Gary Whitehead  Procedure(s) Performed: Left distal radius fracture open reduction internal fixation and surgery as indicated (Left Wrist)  Patient Location: PACU  Anesthesia Type:MAC combined with regional for post-op pain  Level of Consciousness: drowsy and patient cooperative  Airway & Oxygen Therapy: Patient Spontanous Breathing and Patient connected to face mask oxygen  Post-op Assessment: Report given to RN and Post -op Vital signs reviewed and stable  Post vital signs: Reviewed and stable  Last Vitals:  Vitals Value Taken Time  BP    Temp    Pulse    Resp    SpO2      Last Pain:  Vitals:   11/24/20 0713  TempSrc: Oral  PainSc: 4       Patients Stated Pain Goal: 4 (88/75/79 7282)  Complications: No complications documented.

## 2020-11-24 NOTE — Anesthesia Procedure Notes (Signed)
Anesthesia Regional Block: Supraclavicular block   Pre-Anesthetic Checklist: ,, timeout performed, Correct Patient, Correct Site, Correct Laterality, Correct Procedure, Correct Position, site marked, Risks and benefits discussed,  Surgical consent,  Pre-op evaluation,  At surgeon's request and post-op pain management  Laterality: Left  Prep: chloraprep       Needles:  Injection technique: Single-shot  Needle Type: Echogenic Stimulator Needle     Needle Length: 10cm  Needle Gauge: 20     Additional Needles:   Procedures:,,,, ultrasound used (permanent image in chart),,,,  Narrative:  Start time: 11/24/2020 7:40 AM End time: 11/24/2020 7:45 AM Injection made incrementally with aspirations every 5 mL.  Performed by: Personally  Anesthesiologist: Merlinda Frederick, MD  Additional Notes: Functioning IV was confirmed and monitors applied.  A 44mm 22ga echogenic arrow stimulator was used. Sterile prep and drape,hand hygiene and sterile gloves were used.Ultrasound guidance: relevant anatomy identified, needle position confirmed, local anesthetic spread visualized around nerve(s)., vascular puncture avoided.  Image printed for medical record.  Negative aspiration and negative test dose prior to incremental administration of local anesthetic. The patient tolerated the procedure well.

## 2020-11-24 NOTE — Op Note (Signed)
PREOPERATIVE DIAGNOSIS: Comminuted intra-articular greater than 3 part left distal radius fracture  POSTOPERATIVE DIAGNOSIS: Same  ATTENDING PHYSICIAN: Maudry Mayhew. Jeannie Fend, III, MD who was present and scrubbed for the entire case   ASSISTANT SURGEON: None.   ANESTHESIA: Regional with MAC  SURGICAL PROCEDURES:  #1 open reduction and internal fixation of left distal radius fracture with greater than 3 parts, intra-articular 2.  Left brachioradialis tenotomy  SURGICAL INDICATIONS: Patient is a 69 year old male who over the weekend fell off a ladder onto an outstretched left arm.  He experienced immediate pain and deformity to his left wrist and was seen at [facility], ER.  He is found to have a highly comminuted and intra-articular displaced left distal radius fracture.  He underwent closed reduction in the ER and then was seen by me in clinic.  He still had persistent joint incongruity and displacement of his fracture so I did recommend proceeding forward with open reduction and internal fixation.  He presents today for surgical fixation of his left wrist.  FINDING: Highly comminuted intra-articular and displaced left distal radius fracture.  Near-anatomic alignment was achieved and stable fixation using a volar locking plate and screw construct.  Brachioradialis tenotomy was performed to help aid in reduction  DESCRIPTION OF PROCEDURE: Patient was identified in the preoperative holding area where the risk benefits and alternatives of the procedure were once again discussed with the patient.  These risks include but are not limited to infection, bleeding, damage to surrounding structures including blood vessels and nerves, pain, stiffness, malunion, nonunion, implant failure need for additional procedures.  Informed consent was obtained at that time the patient's left wrist was marked with a surgical marking pen.  He then underwent a left upper extremity plexus block by anesthesia.  He was then  brought to the operative suite where timeout was performed identifying the correct patient operative site.  He was positioned supine on the operative table with his hand outstretched on a hand table.  Preoperative antibiotics were administered.  The patient was then induced under MAC sedation.  A tourniquet was placed on the upper arm and the left upper extremity was prepped and draped in usual sterile fashion.  The limb was exsanguinated and the tourniquet was inflated.  A volar incision was made centered over the FCR tendon.  This cross the wrist flexion crease and angular type fashion.  Dissection was carried down through subcutaneous tissues and the fascia overlying the FCR tendon was incised.  The tendon was mobilized ulnarly.  The deep FCR fascia was then incised and the FPL tendon was bluntly dissected and mobilized ulnarly as well.  In doing so there was visualization of the highly comminuted fracture.  There was extensive tearing and damage to the pronator quadratus.  What was left of the muscle was elevated off the volar distal radius using a wood handle elevator.  There were multiple bony fragments throughout the wound and soft tissue stripping along the dorsal aspect of the distal radius.  Manipulation of the fracture was then performed with longitudinal traction and elevation of the fragments using a Soil scientist.  There was significant shortening of the radial column so the brachioradialis tendon was identified on the radial styloid.  It was then incised performed a brachioradialis tenotomy.  This significantly aided in the ability to manipulate and mobilize the radial column.    Longitudinal traction through the wrist as well as ulnar deviation over stack of towels allowed for significant provement in the alignment and  reduction of the articular surface.  Multiple K wires were placed the radial styloid helping to hold provisional reduction.  There was improved joint congruity of the articular  surface though there is still some shortening and incomplete recovery of radial height.  Despite this though the Acumed volar locking plate, proximally fitting, was placed on the volar distal radius.  K wires were placed through the plate to help provisionally hold it in place.  Multiple distal locking screws were then placed through the plate securing the plate down volarly.  Appropriate positioning of the plate and screw length was confirmed under fluoroscopic images.  Longitudinal traction was then placed through the plate and a cortical screw was placed through the oblong hole helping to regain additional height and length through the fracture.  This achieved a near anatomic reduction and alignment of the fracture at this point.  Multiple additional locking screws were then placed in the proximal portion of the plate and the remaining holes in the distal row of the plate were filled with locking screws.  All K wires were removed at this point.  Fluoroscopic images were again obtained which showed near-anatomic alignment of the fracture and articular surface.  There was significant dorsal comminution which was unchanged.  The radial height and inclination had significant improved from the preoperative baseline.  The wrist was taken through range of motion and found to be stable with flexion, extension, pronation and supination.  The DRUJ was stressed in both pronation supination found to be stable.  At this point the wound was copiously irrigated with normal saline.  When looking through the volar locking plate there was a bone void in the central portion.  This was filled with demineralized bone matrix putty.  The wound was again irrigated and then the skin was closed with interrupted 4-0 Prolene sutures.  Xeroform, 4 x 4's and well-padded volar slab splint was placed.  The tourniquet was released and the patient had return of brisk capillary refill to all his digits.  He was awoken from sedation and taken the  PACU in stable condition.  He tolerated the procedure well and there were no complications.  RADIOGRAPHIC INTERPRETATION: AP, lateral and fossa lateral fluoroscopic images were obtained Intra-Op.  These show significant provement in the alignment and reduction of the intra-articular displaced distal radius fracture.  Volar locking plate and screw construct is intact.  No other fractures or dislocations are noted.  ESTIMATED BLOOD LOSS: 10 mL  TOURNIQUET TIME: 73 minutes  SPECIMENS: None  POSTOPERATIVE PLAN: The patient will be discharged home and seen back  in the office in approximately 10-12 days for wound check, suture  removal, and then be sent to a therapist for volar wrist splint and early wrist range of motion including flexion, extension, pronation and supination, edema control and modalities as indicated  IMPLANTS: Acumed proximally fitting, wide volar locking plate and screw construct.

## 2020-11-24 NOTE — Progress Notes (Signed)
Assisted Dr. Elgie Congo with left, ultrasound guided, supraclavicular block. Side rails up, monitors on throughout procedure. See vital signs in flow sheet. Tolerated Procedure well.

## 2020-11-24 NOTE — Anesthesia Postprocedure Evaluation (Signed)
Anesthesia Post Note  Patient: Gary Whitehead  Procedure(s) Performed: Left distal radius fracture open reduction internal fixation and surgery as indicated (Left Wrist)     Patient location during evaluation: PACU Anesthesia Type: Regional and MAC Level of consciousness: awake and alert Pain management: pain level controlled Vital Signs Assessment: post-procedure vital signs reviewed and stable Respiratory status: spontaneous breathing, nonlabored ventilation and respiratory function stable Cardiovascular status: stable and blood pressure returned to baseline Postop Assessment: no apparent nausea or vomiting Anesthetic complications: no   No complications documented.  Last Vitals:  Vitals:   11/24/20 1037 11/24/20 1102  BP: 130/74 117/68  Pulse: 79 83  Resp: 12 20  Temp:  36.4 C  SpO2: 97% 96%    Last Pain:  Vitals:   11/24/20 1102  TempSrc:   PainSc: 0-No pain                 Merlinda Frederick

## 2020-11-24 NOTE — Anesthesia Preprocedure Evaluation (Addendum)
Anesthesia Evaluation  Patient identified by MRN, date of birth, ID band Patient awake    Reviewed: Allergy & Precautions, NPO status , Patient's Chart, lab work & pertinent test results  Airway Mallampati: II      Comment: Redundant neck tissue Dental  (+) Edentulous Upper, Edentulous Lower   Pulmonary Current Smoker and Patient abstained from smoking.,    Pulmonary exam normal breath sounds clear to auscultation       Cardiovascular hypertension, Normal cardiovascular exam Rhythm:Regular Rate:Normal     Neuro/Psych negative neurological ROS  negative psych ROS   GI/Hepatic negative GI ROS, Neg liver ROS,   Endo/Other  negative endocrine ROS  Renal/GU negative Renal ROS     Musculoskeletal  (+) Arthritis ,   Abdominal   Peds negative pediatric ROS (+)  Hematology negative hematology ROS (+)   Anesthesia Other Findings   Reproductive/Obstetrics                           Anesthesia Physical Anesthesia Plan  ASA: II  Anesthesia Plan: MAC and Regional   Post-op Pain Management:  Regional for Post-op pain   Induction: Intravenous  PONV Risk Score and Plan: 2 and Propofol infusion, TIVA and Treatment may vary due to age or medical condition  Airway Management Planned: Natural Airway and Simple Face Mask  Additional Equipment:   Intra-op Plan:   Post-operative Plan:   Informed Consent: I have reviewed the patients History and Physical, chart, labs and discussed the procedure including the risks, benefits and alternatives for the proposed anesthesia with the patient or authorized representative who has indicated his/her understanding and acceptance.       Plan Discussed with: Anesthesiologist and CRNA  Anesthesia Plan Comments:        Anesthesia Quick Evaluation

## 2020-11-24 NOTE — Discharge Instructions (Signed)
Discharge Instructions  - Keep dressings in place. Do not remove them. - The dressings must stay dry - Take all medication as prescribed. Transition to over the counter pain medication as your pain improves - Keep the hand elevated over the next 48-72 hours to help with pain and swelling - Move all digits not restricted by the dressings regularly to prevent stiffness - Please call to schedule a follow up appointment with Dr. Jeannie Fend and therapy at [phone] for 10-14 days following surgery - Your pain medication have been sent digitally to your pharmacy  Regional Anesthesia Blocks  1. Numbness or the inability to move the "blocked" extremity may last from 3-48 hours after placement. The length of time depends on the medication injected and your individual response to the medication. If the numbness is not going away after 48 hours, call your surgeon.  2. The extremity that is blocked will need to be protected until the numbness is gone and the  Strength has returned. Because you cannot feel it, you will need to take extra care to avoid injury. Because it may be weak, you may have difficulty moving it or using it. You may not know what position it is in without looking at it while the block is in effect.  3. For blocks in the legs and feet, returning to weight bearing and walking needs to be done carefully. You will need to wait until the numbness is entirely gone and the strength has returned. You should be able to move your leg and foot normally before you try and bear weight or walk. You will need someone to be with you when you first try to ensure you do not fall and possibly risk injury.  4. Bruising and tenderness at the needle site are common side effects and will resolve in a few days.  5. Persistent numbness or new problems with movement should be communicated to the surgeon or the [facility] [phone] [place] [phone]). Post Anesthesia  Home Care Instructions  Activity: Get plenty of rest for the remainder of the day. A responsible individual must stay with you for 24 hours following the procedure.  For the next 24 hours, DO NOT: -Drive a car -Paediatric nurse -Drink alcoholic beverages -Take any medication unless instructed by your physician -Make any legal decisions or sign important papers.  Meals: Start with liquid foods such as gelatin or soup. Progress to regular foods as tolerated. Avoid greasy, spicy, heavy foods. If nausea and/or vomiting occur, drink only clear liquids until the nausea and/or vomiting subsides. Call your physician if vomiting continues.  Special Instructions/Symptoms: Your throat may feel dry or sore from the anesthesia or the breathing tube placed in your throat during surgery. If this causes discomfort, gargle with warm salt water. The discomfort should disappear within 24 hours.  If you had a scopolamine patch placed behind your ear for the management of post- operative nausea and/or vomiting:  1. The medication in the patch is effective for 72 hours, after which it should be removed.  Wrap patch in a tissue and discard in the trash. Wash hands thoroughly with soap and water. 2. You may remove the patch earlier than 72 hours if you experience unpleasant side effects which may include dry mouth, dizziness or visual disturbances. 3. Avoid touching the patch. Wash your hands with soap and water after contact with the patch.    You may take Tylenol again at 1:20pm today.

## 2020-11-24 NOTE — H&P (Signed)
ORTHOPAEDIC H&P  PCP:  Leonel Ramsay, MD  Chief Complaint: Left distal radius fracture  HPI: Gary Whitehead is a 69 y.o. male who complains of left distal radius fracture.  Over the weekend the patient fell off a ladder on outstretched left wrist.  He was seen at [facility], ER where he was found to have a comminuted intra-articular left distal radius fracture.  He underwent closed reduction in the ER and then presented to my clinic earlier this week.  He had persistent displacement and comminution of his distal radius and we discussed proceeding forward with left distal radius open reduction and internal fixation.  After having this discussion he did agree to proceed and presents today for operative fixation of his left wrist.  Past Medical History:  Diagnosis Date  . Arthritis    right ankle  . Closed fracture of left distal radius   . Hypertension   . Patient denies medical problems    Past Surgical History:  Procedure Laterality Date  . COLONOSCOPY     Social History   Socioeconomic History  . Marital status: Married    Spouse name: Not on file  . Number of children: Not on file  . Years of education: Not on file  . Highest education level: Not on file  Occupational History  . Not on file  Tobacco Use  . Smoking status: Current Every Day Smoker    Packs/day: 0.25    Types: Cigarettes    Last attempt to quit: 06/24/2019    Years since quitting: 1.4  . Smokeless tobacco: Never Used  . Tobacco comment: 1 1/5 ppd  Vaping Use  . Vaping Use: Never used  Substance and Sexual Activity  . Alcohol use: No  . Drug use: No  . Sexual activity: Yes    Comment: MArried  Other Topics Concern  . Not on file  Social History Narrative  . Not on file   Social Determinants of Health   Financial Resource Strain:   . Difficulty of Paying Living Expenses: Not on file  Food Insecurity:   . Worried About Charity fundraiser in the Last Year: Not on file  . Ran Out of  Food in the Last Year: Not on file  Transportation Needs:   . Lack of Transportation (Medical): Not on file  . Lack of Transportation (Non-Medical): Not on file  Physical Activity:   . Days of Exercise per Week: Not on file  . Minutes of Exercise per Session: Not on file  Stress:   . Feeling of Stress : Not on file  Social Connections:   . Frequency of Communication with Friends and Family: Not on file  . Frequency of Social Gatherings with Friends and Family: Not on file  . Attends Religious Services: Not on file  . Active Member of Clubs or Organizations: Not on file  . Attends Archivist Meetings: Not on file  . Marital Status: Not on file   History reviewed. No pertinent family history. No Known Allergies Prior to Admission medications   Medication Sig Start Date End Date Taking? Authorizing Provider  acetaminophen (TYLENOL) 325 MG tablet Take 650 mg by mouth every 6 (six) hours as needed.   Yes [provider]  Ascorbic Acid (VITAMIN C) 1000 MG tablet Take 1,000 mg by mouth daily.   Yes [provider]  aspirin EC 81 MG tablet Take 81 mg by mouth daily. Swallow whole.   Yes [provider]  cholecalciferol (VITAMIN D3) 25 MCG (1000 UNIT) tablet Take 1,000 Units by mouth daily.   Yes [provider]  losartan (COZAAR) 25 MG tablet Take 25 mg by mouth daily.   Yes [provider]  oxyCODONE-acetaminophen (PERCOCET) 5-325 MG tablet Take 1 tablet by mouth every 4 (four) hours as needed for severe pain. 11/19/20 11/19/21 Yes Fransico Meadow, PA-C  Quercetin 250 MG TABS Take by mouth.   Yes [provider]  vitamin B-12 (CYANOCOBALAMIN) 1000 MCG tablet Take 1,000 mcg by mouth daily.   Yes [provider]  zinc gluconate 50 MG tablet Take 50 mg by mouth daily.   Yes [provider]   No results found.  Positive ROS: All other systems have been reviewed and were otherwise negative with the exception of  those mentioned in the HPI and as above.  Physical Exam: General: Alert, no acute distress Cardiovascular: No edema Respiratory: No cyanosis, no use of accessory musculature Skin: No lesions in the area of chief complaint  Psychiatric: Patient is competent for consent with normal mood and affect  MUSCULOSKELETAL: Deformity to the left wrist.  No open wounds, skin tenting.  Mild/moderate swelling to the wrist.  Intact motor and sensation to the digits.  Fingertips are warm well perfused with brisk capillary refill.  Assessment: Comminuted, intra-articular left distal radius fracture  Plan: Proceed forward with open reduction and internal fixation of left distal radius fracture.  Risk, benefits and alternatives of procedure were discussed with patient once again.  These risks include but are not limited to infection, bleeding, damage to surrounding structures including blood vessels and nerves, pain, stiffness, malunion, nonunion, implant failure and need for additional procedures.  Informed consent was obtained the left wrist was marked.  Plan for discharge home postoperatively with follow-up with me in approximately 10 to 14 days.    Verner Mould, MD [phone]   11/24/2020 8:19 AM

## 2020-11-25 ENCOUNTER — Encounter (HOSPITAL_BASED_OUTPATIENT_CLINIC_OR_DEPARTMENT_OTHER): Payer: Self-pay | Admitting: Orthopaedic Surgery

## 2020-12-28 ENCOUNTER — Ambulatory Visit
Admission: RE | Admit: 2020-12-28 | Discharge: 2020-12-28 | Disposition: A | Discharge status: Home / Self Care | Payer: Medicare Other | Source: Ambulatory Visit | Attending: Oncology | Admitting: Oncology

## 2020-12-28 ENCOUNTER — Other Ambulatory Visit: Payer: Self-pay

## 2020-12-28 DIAGNOSIS — R911 Solitary pulmonary nodule: Secondary | ICD-10-CM | POA: Insufficient documentation

## 2021-01-03 ENCOUNTER — Inpatient Hospital Stay: Payer: Medicare Other | Attending: Oncology | Admitting: Oncology

## 2021-01-03 DIAGNOSIS — R911 Solitary pulmonary nodule: Secondary | ICD-10-CM | POA: Diagnosis not present

## 2021-01-03 NOTE — Progress Notes (Signed)
Pulmonary Nodule Clinic Consult note [facility]  Telephone:([phone] [phone]  Patient Care Team: Leonel Ramsay, MD as PCP - General (Infectious Diseases)   Name of the patient: Gary Whitehead  [id number]  January 22, 1951   Date of visit: 01/03/2021   Diagnosis- Lung Nodule  Virtual Visit via Telephone Note   I connected with Gary Whitehead  on 01/09/21 at 4pm by telephone visit and verified that I am speaking with the correct person using two identifiers.   I discussed the limitations, risks, security and privacy concerns of performing an evaluation and management service by telemedicine and the availability of in-person appointments. I also discussed with the patient that there may be a patient responsible charge related to this service. The patient expressed understanding and agreed to proceed.   Other persons participating in the visit and their role in the encounter:   Patient's location: Home  Provider's location: Clinic   Chief complaint/ Reason for visit- Pulmonary Nodule Clinic Initial Visit  Past Medical History:  Patient was evaluated back in August 2020 for cough and fever with some confusion.  Imaging revealed pneumonia.  He met sepsis criteria and was treated accordingly.  CT chest incidentally also noted tiny bilateral pulmonary nodules measuring 2 mm in the right upper lobe and 2 mm in the left lower lobe.  He was also found to have right adrenal thickening with a 1.5 cm nodule.  Interval history-today he presents to review most recent CT chest.   He is currently a non-smoker.  He quit over 10 years ago.  He smoked 1 to 2 packs/day for approximately 20 years.  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  He has no personal history of cancer.  He thinks his mom died of breast cancer when she was really young.  He denies any  respiratory concerns at this time.  He continues to recover from a wrist injury secondary to a fall.  He required surgery to his left distal radius.  This was completed on 11/24/2020 by Dr. Jeannie Fend.  He lives at home with his wife.  Denies any neurologic complaints. Denies recent fevers or illnesses. Denies any easy bleeding or bruising. Reports good appetite and denies weight loss. Denies chest pain. Denies any nausea, vomiting, constipation, or diarrhea. Denies urinary complaints. Patient offers no further specific complaints today.   ECOG FS:0 - Asymptomatic  Review of systems- Review of Systems  Musculoskeletal: Positive for joint pain (Left wrist).     No Known Allergies   Past Medical History:  Diagnosis Date  . Arthritis    right ankle  . Closed fracture of left distal radius   . Hypertension   . Patient denies medical problems      Past Surgical History:  Procedure Laterality Date  . COLONOSCOPY    . ORIF WRIST FRACTURE Left 11/24/2020   Procedure: Left distal radius fracture open reduction internal fixation and surgery as indicated;  Surgeon: Verner Mould, MD;  Location: [place];  Service: Orthopedics;  Laterality: Left;    Social History   Socioeconomic History  . Marital status: Married    Spouse name: Not on file  . Number of children: Not on file  . Years of education: Not on file  . Highest education level: Not on file  Occupational History  . Not on file  Tobacco Use  .  Smoking status: Current Every Day Smoker    Packs/day: 0.25    Types: Cigarettes    Last attempt to quit: 06/24/2019    Years since quitting: 1.5  . Smokeless tobacco: Never Used  . Tobacco comment: 1 1/5 ppd  Vaping Use  . Vaping Use: Never used  Substance and Sexual Activity  . Alcohol use: No  . Drug use: No  . Sexual activity: Yes    Comment: MArried  Other Topics Concern  . Not on file  Social History Narrative  . Not on file   Social  Determinants of Health   Financial Resource Strain: Not on file  Food Insecurity: Not on file  Transportation Needs: Not on file  Physical Activity: Not on file  Stress: Not on file  Social Connections: Not on file  Intimate Partner Violence: Not on file    No family history on file.   Current Outpatient Medications:  .  acetaminophen (TYLENOL) 325 MG tablet, Take 650 mg by mouth every 6 (six) hours as needed., Disp: , Rfl:  .  Ascorbic Acid (VITAMIN C) 1000 MG tablet, Take 1,000 mg by mouth daily., Disp: , Rfl:  .  aspirin EC 81 MG tablet, Take 81 mg by mouth daily. Swallow whole., Disp: , Rfl:  .  cholecalciferol (VITAMIN D3) 25 MCG (1000 UNIT) tablet, Take 1,000 Units by mouth daily., Disp: , Rfl:  .  losartan (COZAAR) 25 MG tablet, Take 25 mg by mouth daily., Disp: , Rfl:  .  oxyCODONE-acetaminophen (PERCOCET) 5-325 MG tablet, Take 1-2 tablets by mouth every 6 (six) hours as needed for severe pain., Disp: 30 tablet, Rfl: 0 .  Quercetin 250 MG TABS, Take by mouth., Disp: , Rfl:  .  vitamin B-12 (CYANOCOBALAMIN) 1000 MCG tablet, Take 1,000 mcg by mouth daily., Disp: , Rfl:  .  zinc gluconate 50 MG tablet, Take 50 mg by mouth daily., Disp: , Rfl:   Physical exam: There were no vitals filed for this visit. -Limited secondary to telephone visit. -Patient in no acute distress.  CMP Latest Ref Rng & Units 08/21/2019  Glucose 70 - 99 mg/dL -  BUN 8 - 23 mg/dL -  Creatinine 0.61 - 1.24 mg/dL -  Sodium 135 - 145 mmol/L -  Potassium 3.5 - 5.1 mmol/L 3.6  Chloride 98 - 111 mmol/L -  CO2 22 - 32 mmol/L -  Calcium 8.9 - 10.3 mg/dL -  Total Protein 6.5 - 8.1 g/dL -  Total Bilirubin 0.3 - 1.2 mg/dL -  Alkaline Phos 38 - 126 U/L -  AST 15 - 41 U/L -  ALT 0 - 44 U/L -   CBC Latest Ref Rng & Units 08/20/2019  WBC 4.0 - 10.5 K/uL 11.2(H)  Hemoglobin 13.0 - 17.0 g/dL 12.9(L)  Hematocrit 39.0 - 52.0 % 38.5(L)  Platelets 150 - 400 K/uL 303    No images are attached to the  encounter.  CT Chest Wo Contrast  Result Date: 12/28/2020 CLINICAL DATA:  Follow-up pulmonary nodules EXAM: CT CHEST WITHOUT CONTRAST TECHNIQUE: Multidetector CT imaging of the chest was performed following the standard protocol without IV contrast. COMPARISON:  09/25/2019 FINDINGS: Cardiovascular: Scattered aortic atherosclerosis. Aortic valve calcifications. Normal heart size. Scattered left and right coronary artery calcifications. No pericardial effusion. Mediastinum/Nodes: No enlarged mediastinal, hilar, or axillary lymph nodes. Thyroid gland, trachea, and esophagus demonstrate no significant findings. Lungs/Pleura: Diffuse bilateral bronchial wall thickening. Minimal biapical pleuroparenchymal scarring. Minimal bandlike scarring of the right lung base. There  are numerous tiny pulmonary nodules scattered against a background of very fine centrilobular nodularity, for example a 2 mm nodule in the anterior left apex (series 3, image 36). These are stable compared to prior examination and definitively benign. No pleural effusion or pneumothorax. Upper Abdomen: No acute abnormality. Stable, definitively benign fat attenuation left adrenal adenoma (series 2, image 154). Musculoskeletal: No chest wall mass or suspicious bone lesions identified. IMPRESSION: 1. There are numerous tiny pulmonary nodules scattered against a background of very fine centrilobular nodularity. These are stable compared to prior examination and definitively benign, most likely reflecting smoking-related respiratory bronchiolitis. 2. Diffuse bilateral bronchial wall thickening, consistent with nonspecific infectious or inflammatory bronchitis, again most likely smoking-related. 3. Coronary artery disease. 4. Aortic valve calcifications. Correlate with echocardiographic evidence of aortic valve dysfunction. 5. Stable, definitively benign fat attenuation left adrenal adenoma. No further follow-up or characterization is required Aortic  Atherosclerosis (ICD10-I70.0). Electronically Signed   By: Eddie Candle M.D.   On: 12/28/2020 08:40     Assessment and plan- Patient is a 70 y.o. male who presents to pulmonary nodule clinic for follow-up of incidental lung nodules.  A telephone visit was conducted to review most recent CT scan results.    CT chest without contrast from 12/29/2019 showed numerous tiny pulmonary nodules scattered that are stable compared to prior exam and definitively benign, most likely reflecting smoking-related respiratory bronchiolitis.  Diffuse bilateral bronchial wall thickening consistent with nonspecific infectious or inflammatory bronchitis again most likely smoking-related.   Calculating malignancy probability of a pulmonary nodule: Risk factors include: 1.  Age. 2.  Cancer history. 3.  Diameter of pulmonary nodule and mm 4.  Location 5.  Smoking history 6.  Spiculation present   Based on risk factors, this patient is  moderate risk for the development of lung cancer.  Given he quit smoking less than 15 years ago, he would benefit from the low-dose CT screening program.  We will make referral.  I do not think he needs any additional follow-up with [place] given the nodules are small and unchanged  for approximately 18 months.  During our visit, we discussed pulmonary nodules are a common incidental finding and are often how lung cancer is discovered.  Lung cancer survival is directly related to the stage at diagnosis.  We discussed that nodules can vary in presentation from solitary pulmonary nodules to masses, 2 groundglass opacities and multiple nodules.  Pulmonary nodules in the majority of cases are benign but the probability of these becoming malignant cannot be undermined.  Early identification of malignant nodules could lead to early diagnosis and increased survival.   We discussed the probability of pulmonary nodules becoming malignant increase with age, pack years of tobacco use, size/characteristics  of the nodule and location; with upper lobe involvement being most worrisome.   We discussed the goal of our clinic is to thoroughly evaluate each nodule, developed a comprehensive, individualized plan of care utilizing the most advanced technology and significantly reduce the time from detection to treatment.  A dedicated pulmonary nodule clinic has proven to indeed expedite the detection and treatment of lung cancer.   Patient education in fact sheet provided along with most recent CT scans.  Plan: Discussed recent CT scan. Discussed patient's past medical history and smoking history. Discussed family history. Discussed occupational exposures. Discussed his risk of developing lung cancer-low to moderate Discussed the low-dose CT screening program which he would still qualify for.  Disposition: No additional follow-up needed in our clinic.  Will refer to our low-dose CT screening program.   Visit Diagnosis 1. Pulmonary nodule     Patient expressed understanding and was in agreement with this plan. He also understands that He can call clinic at any time with any questions, concerns, or complaints.   Greater than 50% was spent in counseling and coordination of care with this patient including but not limited to discussion of the relevant topics above (See A&P) including, but not limited to diagnosis and management of acute and chronic medical conditions.   Thank you for allowing me to participate in the care of this very pleasant patient.    Jacquelin Hawking, NP [place] [facility] Cell - [phone] Pager- [phone] 01/09/2021 12:42 PM

## 2021-11-02 ENCOUNTER — Encounter: Payer: Medicare Other | Admitting: Dermatology

## 2022-03-06 IMAGING — DX DG WRIST COMPLETE 3+V*L*
1 series · 3 of 3 positions shown · non-contrast
Comparison: None.

CLINICAL DATA: Fall from ladder, left wrist deformity

EXAM:
LEFT WRIST - COMPLETE 3+ VIEW

[Series 1: wrist · 0.14mm/px · 3 of 3 slices shown]
[im 1/3]
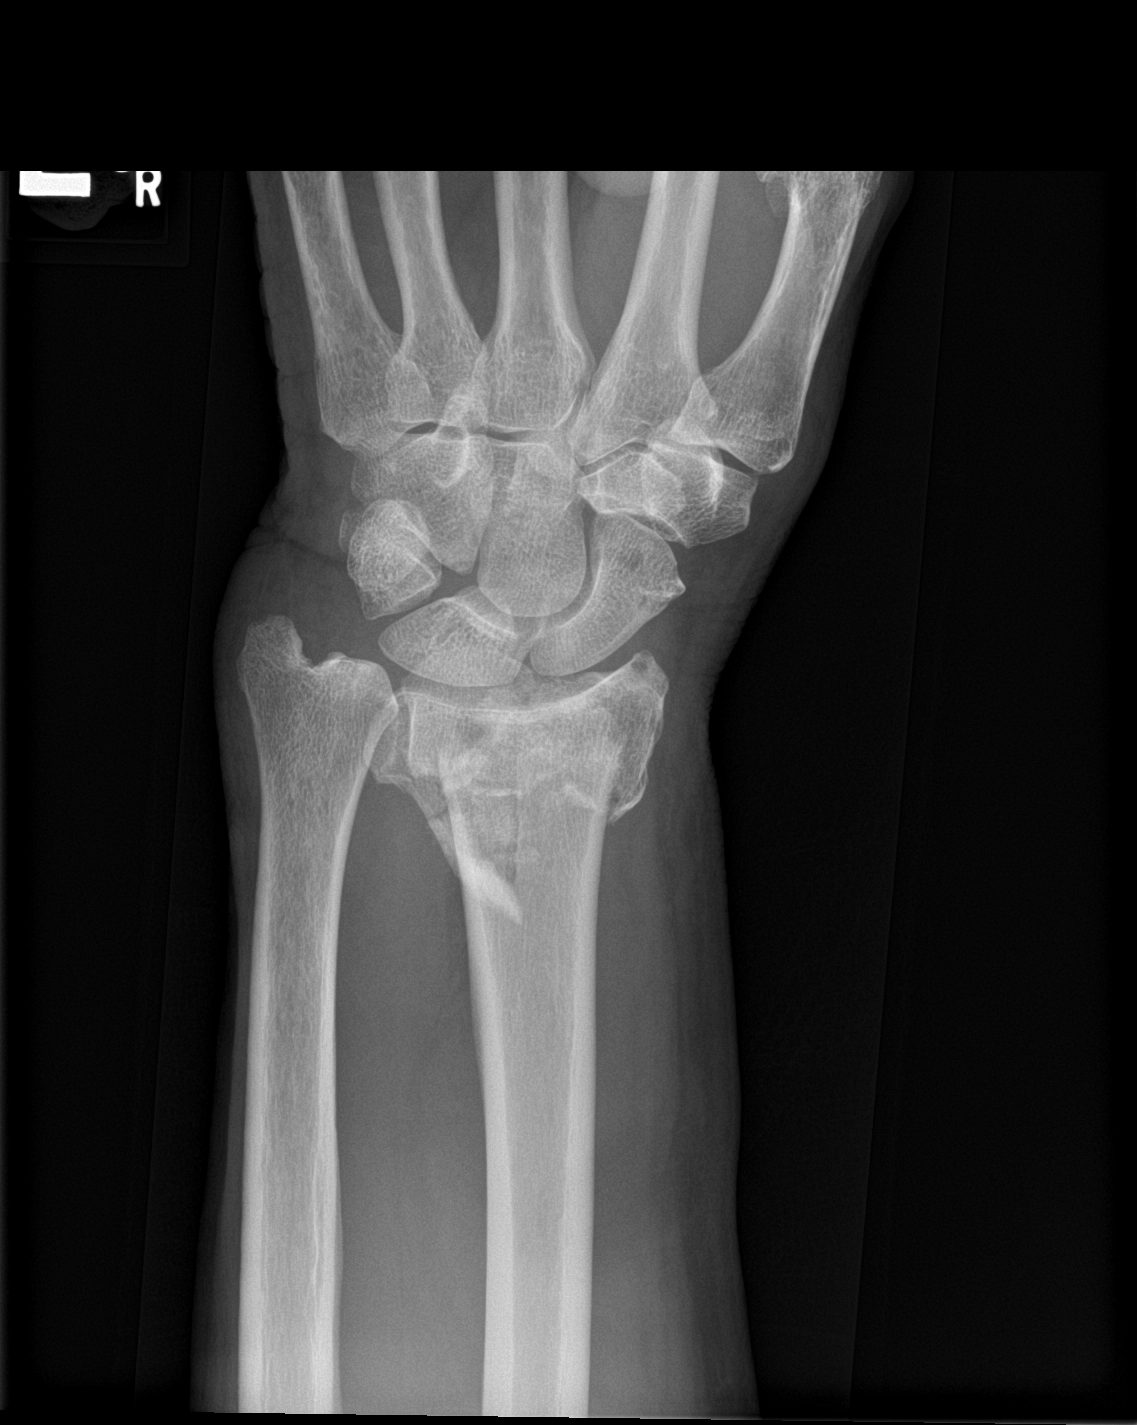
[im 2/3]
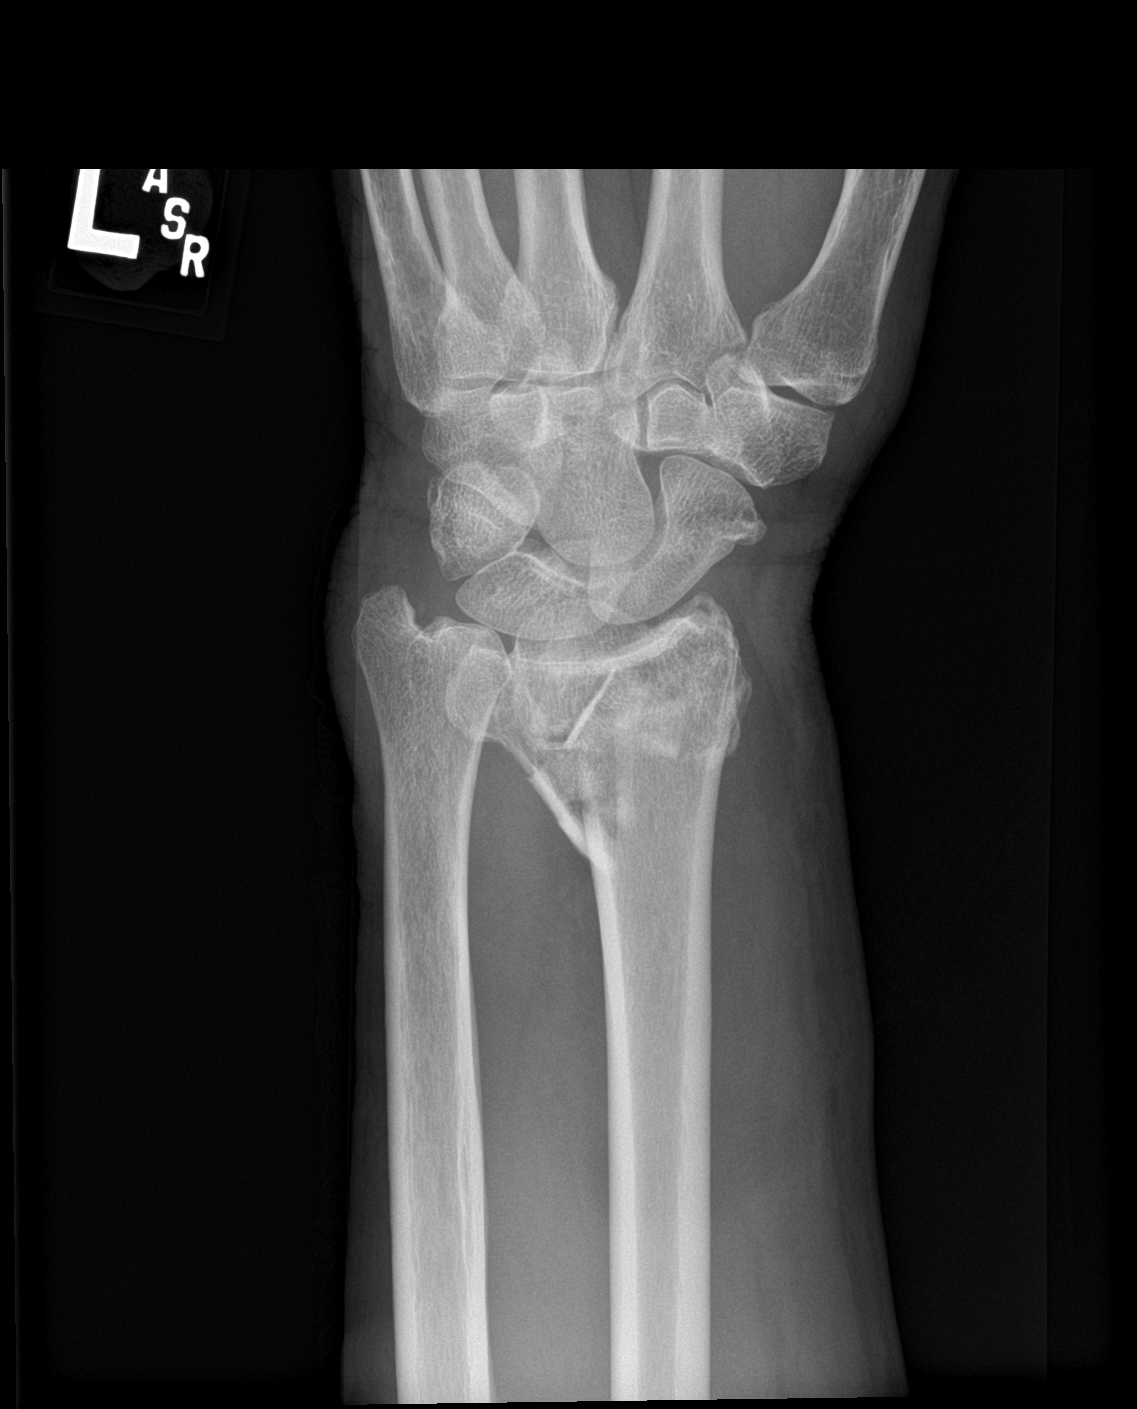
[im 3/3]
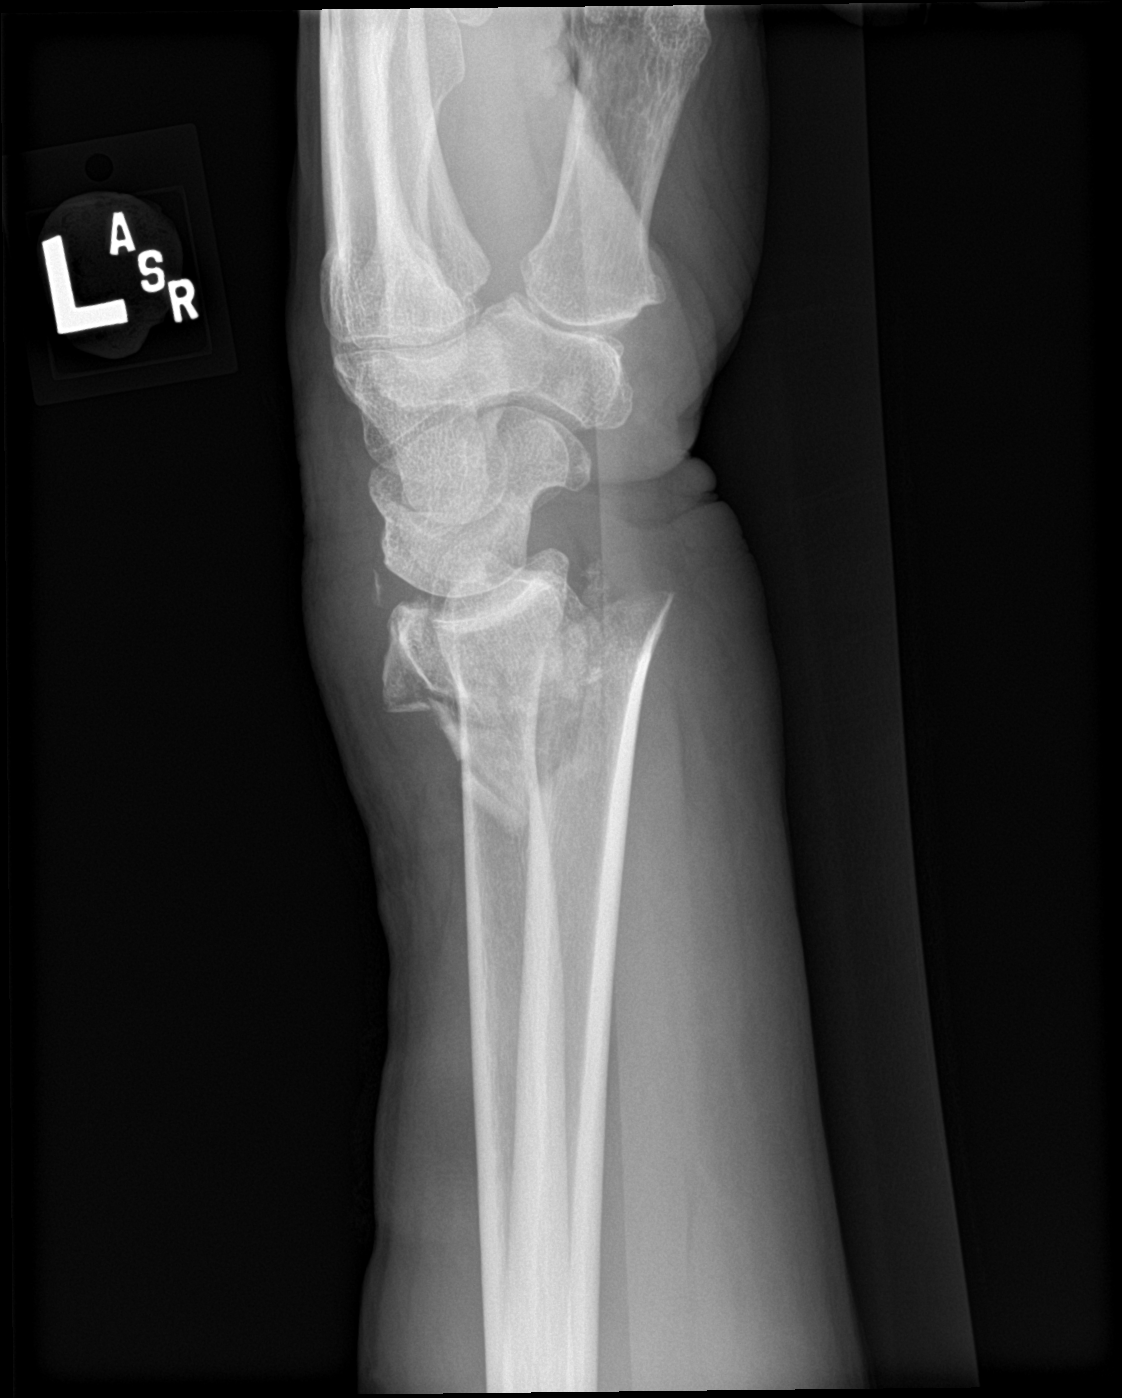

[3 of 3 positions shown; findings below may reference images not displayed]

FINDINGS: Acute comminuted intra-articular fracture of the distal left radial
metaphysis. Fracture is impacted. At least 1.0 cm of dorsal
displacement. Slight dorsal angulation. Alignment of the radiocarpal
joint and distal radioulnar joints are preserved. No distal ulnar
fracture is identified. Carpal bones and carpal intraosseous spaces
are intact. Soft tissue swelling at the fracture site.
IMPRESSION: Acute comminuted intra-articular fracture of the distal left radial
metaphysis with dorsal displacement and slight angulation.

## 2022-03-12 ENCOUNTER — Encounter: Payer: Medicare Other | Admitting: Dermatology

## 2022-04-14 IMAGING — CT CT CHEST W/O CM
2 of 4 series · 15 of 36 positions shown, 18 images · non-contrast
Comparison: 09/25/2019

CLINICAL DATA: Follow-up pulmonary nodules

EXAM:
CT CHEST WITHOUT CONTRAST
TECHNIQUE: Multidetector CT imaging of the chest was performed following the
standard protocol without IV contrast.

[Series 2: chest 2.00 · axial · 0.78mm/px · z∈[-1208,-922]mm · 12 of 169 slices shown, 15 images]
[im 13/169  mediastinal]
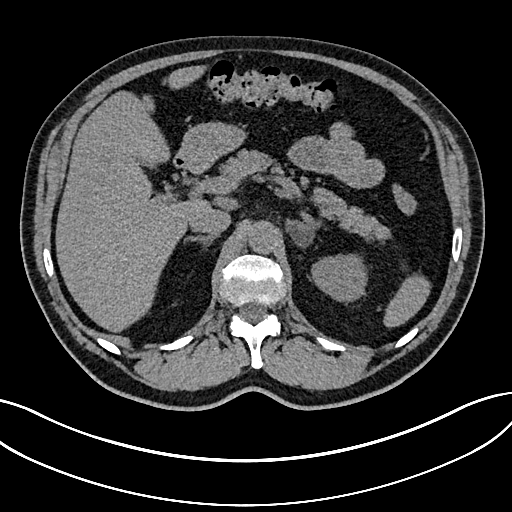
[im 13/169  lung]
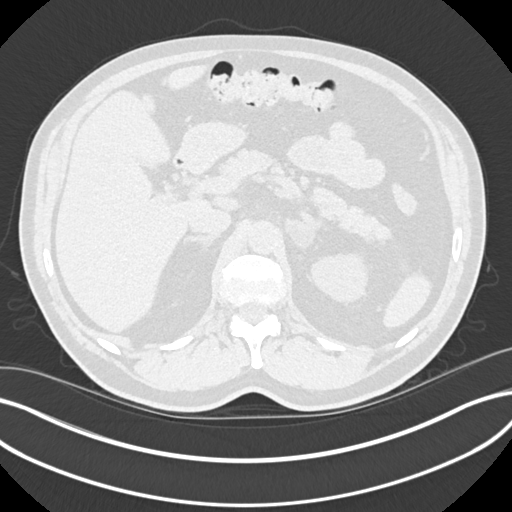
[im 26/169  lung]
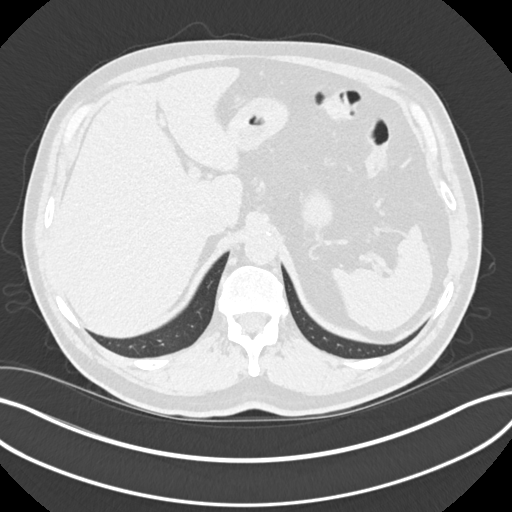
[im 39/169  lung]
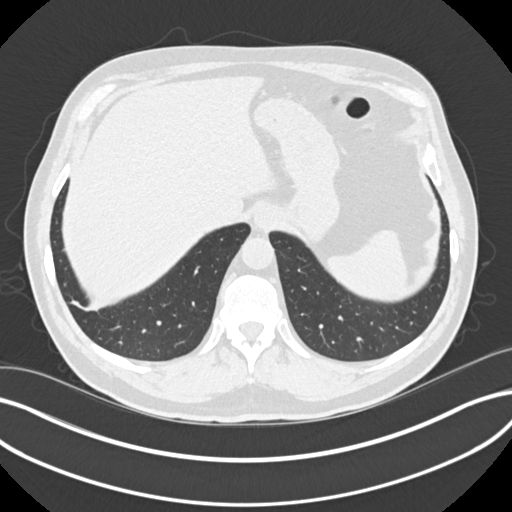
[im 52/169  lung]
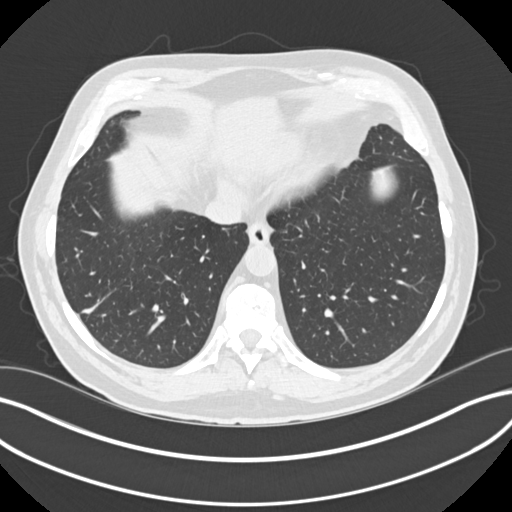
[im 65/169  mediastinal]
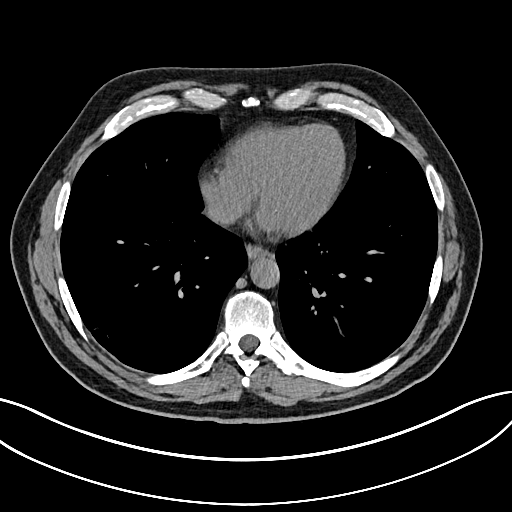
[im 65/169  lung]
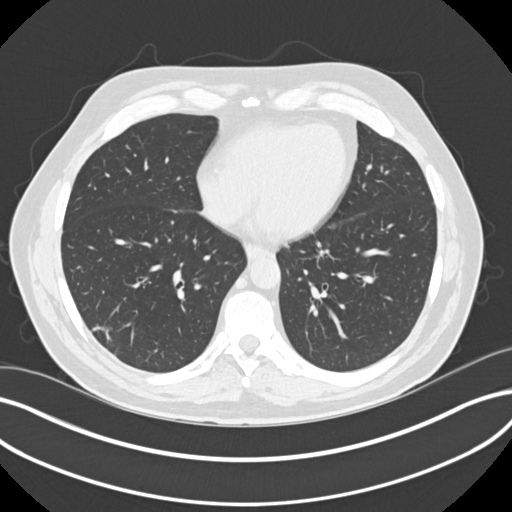
[im 78/169  lung]
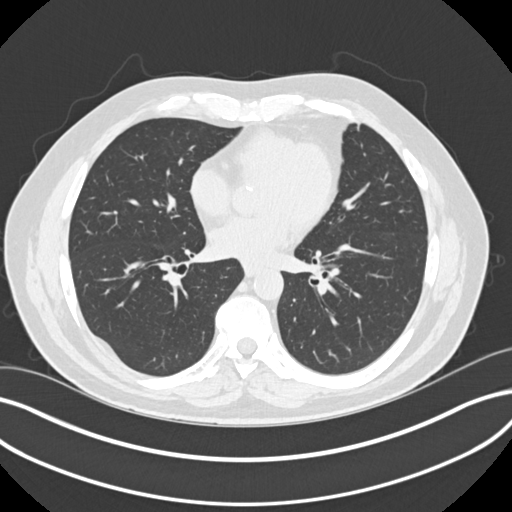
[im 91/169  lung]
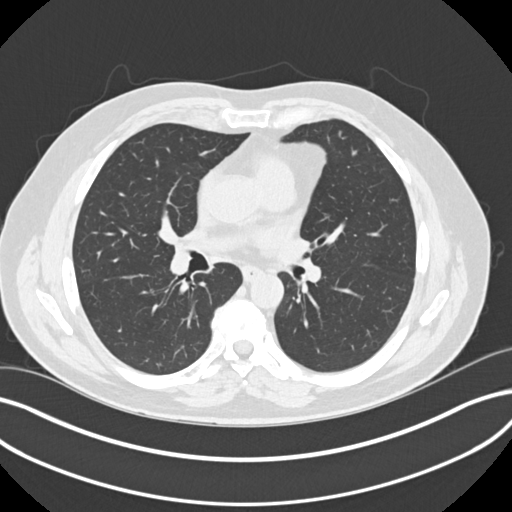
[im 104/169  lung]
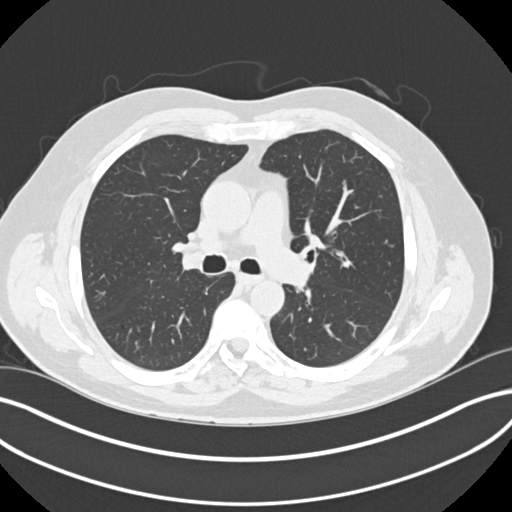
[im 117/169  mediastinal]
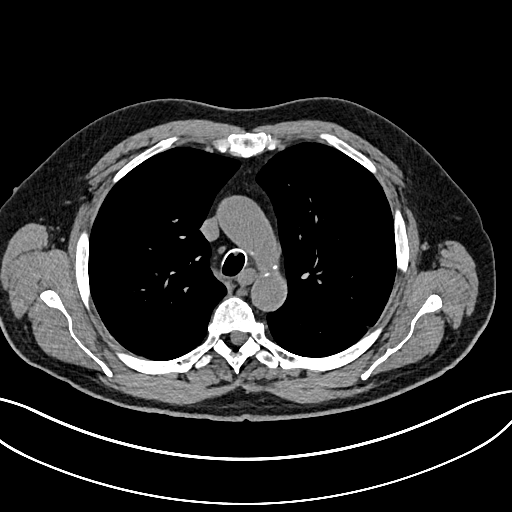
[im 117/169  lung]
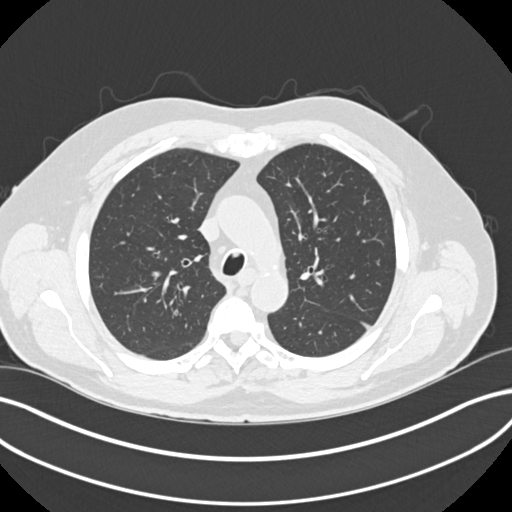
[im 130/169  lung]
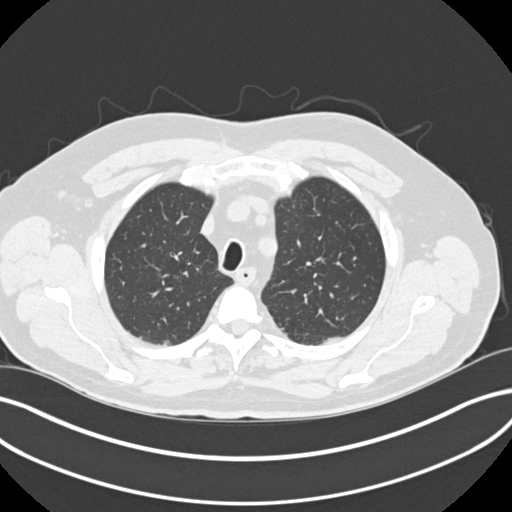
[im 143/169  lung]
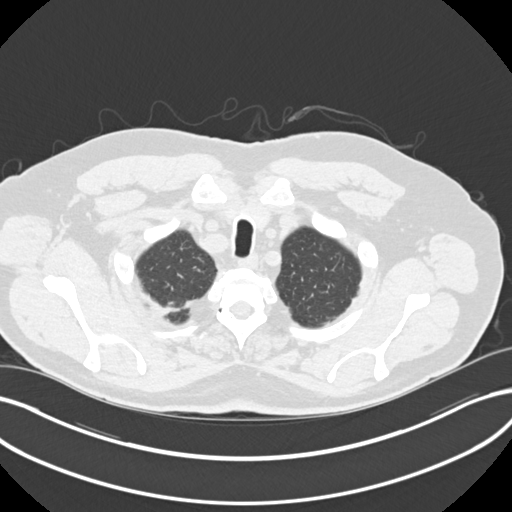
[im 156/169  lung]
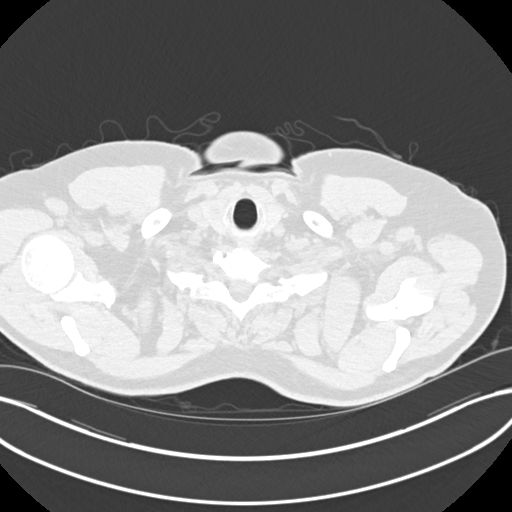

[Series 5: coronals chest 2.00 cor · coronal · 0.66mm/px · 3 of 151 slices shown]
[im 31/151  lung]
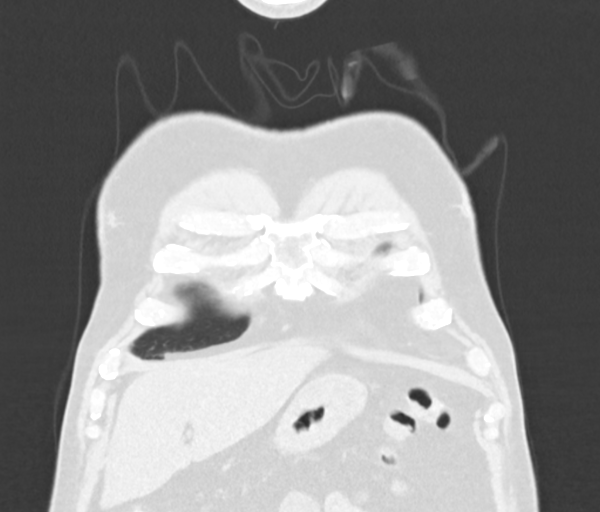
[im 61/151  lung]
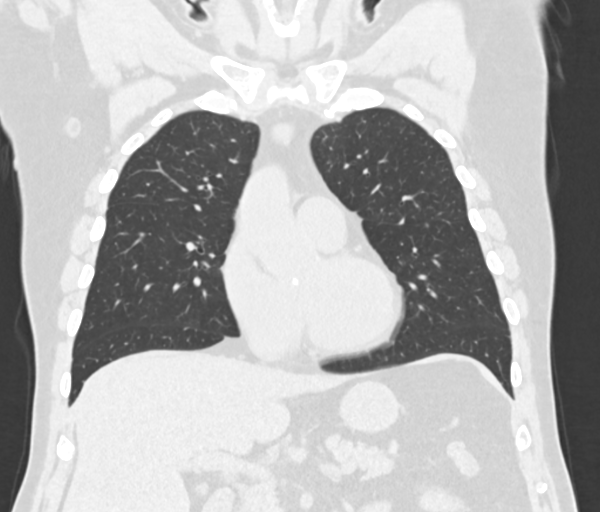
[im 91/151  lung]
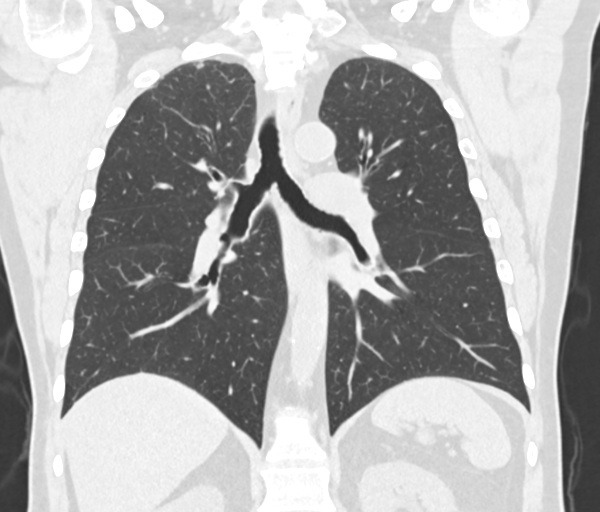

[15 of 36 positions shown; findings below may reference images not displayed]

FINDINGS: Cardiovascular: Scattered aortic atherosclerosis. Aortic valve
calcifications. Normal heart size. Scattered left and right coronary
artery calcifications. No pericardial effusion.

Mediastinum/Nodes: No enlarged mediastinal, hilar, or axillary lymph
nodes. Thyroid gland, trachea, and esophagus demonstrate no
significant findings.

Lungs/Pleura: Diffuse bilateral bronchial wall thickening. Minimal
biapical pleuroparenchymal scarring. Minimal bandlike scarring of
the right lung base. There are numerous tiny pulmonary nodules
scattered against a background of very fine centrilobular
nodularity, for example a 2 mm nodule in the anterior left apex
(series 3, image 36). These are stable compared to prior examination
and definitively benign. No pleural effusion or pneumothorax.

Upper Abdomen: No acute abnormality. Stable, definitively benign fat
attenuation left adrenal adenoma (series 2, image 154).

Musculoskeletal: No chest wall mass or suspicious bone lesions
identified.
IMPRESSION: 1. There are numerous tiny pulmonary nodules scattered against a
background of very fine centrilobular nodularity. These are stable
compared to prior examination and definitively benign, most likely
reflecting smoking-related respiratory bronchiolitis.
2. Diffuse bilateral bronchial wall thickening, consistent with
nonspecific infectious or inflammatory bronchitis, again most likely
smoking-related.
3. Coronary artery disease.
4. Aortic valve calcifications. Correlate with echocardiographic
evidence of aortic valve dysfunction.
5. Stable, definitively benign fat attenuation left adrenal adenoma.
No further follow-up or characterization is required

Aortic Atherosclerosis ([id number]).

## 2022-04-18 ENCOUNTER — Ambulatory Visit (INDEPENDENT_AMBULATORY_CARE_PROVIDER_SITE_OTHER): Payer: Medicare Other | Admitting: Dermatology

## 2022-04-18 DIAGNOSIS — L814 Other melanin hyperpigmentation: Secondary | ICD-10-CM

## 2022-04-18 DIAGNOSIS — Z872 Personal history of diseases of the skin and subcutaneous tissue: Secondary | ICD-10-CM | POA: Diagnosis not present

## 2022-04-18 DIAGNOSIS — L820 Inflamed seborrheic keratosis: Secondary | ICD-10-CM

## 2022-04-18 DIAGNOSIS — Z1283 Encounter for screening for malignant neoplasm of skin: Secondary | ICD-10-CM

## 2022-04-18 DIAGNOSIS — L570 Actinic keratosis: Secondary | ICD-10-CM | POA: Diagnosis not present

## 2022-04-18 DIAGNOSIS — D229 Melanocytic nevi, unspecified: Secondary | ICD-10-CM

## 2022-04-18 DIAGNOSIS — L578 Other skin changes due to chronic exposure to nonionizing radiation: Secondary | ICD-10-CM

## 2022-04-18 DIAGNOSIS — L821 Other seborrheic keratosis: Secondary | ICD-10-CM

## 2022-04-18 DIAGNOSIS — D1800 Hemangioma unspecified site: Secondary | ICD-10-CM

## 2022-04-18 NOTE — Patient Instructions (Signed)

## 2022-04-18 NOTE — Progress Notes (Signed)
? ?Follow-Up Visit ?  ?Subjective  ?Gary Whitehead is a 71 y.o. male who presents for the following: Annual Exam (History of Aks - The patient presents for Total-Body Skin Exam (TBSE) for skin cancer screening and mole check.  The patient has spots, moles and lesions to be evaluated, some may be new or changing and the patient has concerns that these could be cancer./). ? ?The following portions of the chart were reviewed this encounter and updated as appropriate:  ? Tobacco  Allergies  Meds  Problems  Med Hx  Surg Hx  Fam Hx   ?  ?Review of Systems:  No other skin or systemic complaints except as noted in HPI or Assessment and Plan. ? ?Objective  ?Well appearing patient in no apparent distress; mood and affect are within normal limits. ? ?A full examination was performed including scalp, head, eyes, ears, nose, lips, neck, chest, axillae, abdomen, back, buttocks, bilateral upper extremities, bilateral lower extremities, hands, feet, fingers, toes, fingernails, and toenails. All findings within normal limits unless otherwise noted below. ? ?Back x 3, left shoulder x 1, right temple x 1, scalp x 1, right infranasal x 1 (7) ?Erythematous stuck-on, waxy papule or plaque ? ?Right Ear (2) ?Erythematous thin papules/macules with gritty scale.  ? ?Right infraorbital ?Pedunculated flesh papule ? ? ?Assessment & Plan  ? ?Lentigines ?- Scattered tan macules ?- Due to sun exposure ?- Benign-appearing, observe ?- Recommend daily broad spectrum sunscreen SPF 30+ to sun-exposed areas, reapply every 2 hours as needed. ?- Call for any changes ? ?Seborrheic Keratoses ?- Stuck-on, waxy, tan-brown papules and/or plaques  ?- Benign-appearing ?- Discussed benign etiology and prognosis. ?- Observe ?- Call for any changes ? ?Melanocytic Nevi ?- Tan-brown and/or pink-flesh-colored symmetric macules and papules ?- Benign appearing on exam today ?- Observation ?- Call clinic for new or changing moles ?- Recommend daily use of broad  spectrum spf 30+ sunscreen to sun-exposed areas.  ? ?Hemangiomas ?- Red papules ?- Discussed benign nature ?- Observe ?- Call for any changes ? ?Actinic Damage ?- Chronic condition, secondary to cumulative UV/sun exposure ?- diffuse scaly erythematous macules with underlying dyspigmentation ?- Recommend daily broad spectrum sunscreen SPF 30+ to sun-exposed areas, reapply every 2 hours as needed.  ?- Staying in the shade or wearing long sleeves, sun glasses (UVA+UVB protection) and wide brim hats (4-inch brim around the entire circumference of the hat) are also recommended for sun protection.  ?- Call for new or changing lesions. ? ?Skin cancer screening performed today. ? ?Inflamed seborrheic keratosis (7) ?Back x 3, left shoulder x 1, right temple x 1, scalp x 1, right infranasal x 1 ? ?Destruction of lesion - Back x 3, left shoulder x 1, right temple x 1, scalp x 1, right infranasal x 1 ?Complexity: simple   ?Destruction method: cryotherapy   ?Informed consent: discussed and consent obtained   ?Timeout:  patient name, date of birth, surgical site, and procedure verified ?Lesion destroyed using liquid nitrogen: Yes   ?Region frozen until ice ball extended beyond lesion: Yes   ?Outcome: patient tolerated procedure well with no complications   ?Post-procedure details: wound care instructions given   ? ?AK (actinic keratosis) (2) ?Right Ear ? ?Destruction of lesion - Right Ear ?Complexity: simple   ?Destruction method: cryotherapy   ?Informed consent: discussed and consent obtained   ?Timeout:  patient name, date of birth, surgical site, and procedure verified ?Lesion destroyed using liquid nitrogen: Yes   ?Region frozen until  ice ball extended beyond lesion: Yes   ?Outcome: patient tolerated procedure well with no complications   ?Post-procedure details: wound care instructions given   ? ?Seborrheic keratosis ?Right infraorbital ? ?May remove on follow up ? ?Skin cancer screening ? ? ?Return in about 1 year (around  04/19/2023) for TBSE. ? ?I, Ashok Cordia, CMA, am acting as scribe for Sarina Ser, MD . ?Documentation: I have reviewed the above documentation for accuracy and completeness, and I agree with the above. ? ?Sarina Ser, MD ? ?

## 2022-04-29 ENCOUNTER — Encounter: Payer: Self-pay | Admitting: Dermatology

## 2023-01-10 ENCOUNTER — Ambulatory Visit: Payer: Medicare Other | Admitting: Dermatology

## 2024-04-07 ENCOUNTER — Ambulatory Visit: Admitting: Dermatology

## 2024-04-07 DIAGNOSIS — L820 Inflamed seborrheic keratosis: Secondary | ICD-10-CM

## 2024-04-07 DIAGNOSIS — W908XXA Exposure to other nonionizing radiation, initial encounter: Secondary | ICD-10-CM | POA: Diagnosis not present

## 2024-04-07 DIAGNOSIS — L821 Other seborrheic keratosis: Secondary | ICD-10-CM | POA: Diagnosis not present

## 2024-04-07 DIAGNOSIS — L570 Actinic keratosis: Secondary | ICD-10-CM

## 2024-04-07 DIAGNOSIS — Z872 Personal history of diseases of the skin and subcutaneous tissue: Secondary | ICD-10-CM

## 2024-04-07 NOTE — Patient Instructions (Addendum)

## 2024-04-07 NOTE — Progress Notes (Signed)
   Follow-Up Visit   Subjective  Gary Whitehead is a 73 y.o. male who presents for the following: spots at face, none that are bleeding, sore or itching. Patient with no hx skin cancer, does have hx AK's. Would like to schedule a full skin exam.   The patient has spots, moles and lesions to be evaluated, some may be new or changing and the patient may have concern these could be cancer.   The following portions of the chart were reviewed this encounter and updated as appropriate: medications, allergies, medical history  Review of Systems:  No other skin or systemic complaints except as noted in HPI or Assessment and Plan.  Objective  Well appearing patient in no apparent distress; mood and affect are within normal limits.  A focused examination was performed of the following areas: Face, ears, neck  Relevant exam findings are noted in the Assessment and Plan.  R antihelix x 1, R temple x 1 (2) Erythematous stuck-on, waxy papule or plaque L sup forehead x 1 Erythematous thin papules/macules with gritty scale.   Assessment & Plan   SEBORRHEIC KERATOSIS - Stuck-on, waxy, tan-brown papules and/or plaques  - Benign-appearing - Discussed benign etiology and prognosis. - Observe - Call for any changes  INFLAMED SEBORRHEIC KERATOSIS (2) R antihelix x 1, R temple x 1 (2) Symptomatic, irritating, patient would like treated.  Benign-appearing.  Call clinic for new or changing lesions.   Destruction of lesion - R antihelix x 1, R temple x 1 (2) Complexity: simple   Destruction method: cryotherapy   Informed consent: discussed and consent obtained   Timeout:  patient name, date of birth, surgical site, and procedure verified Lesion destroyed using liquid nitrogen: Yes   Region frozen until ice ball extended beyond lesion: Yes   Cryo cycles: 1 or 2. Outcome: patient tolerated procedure well with no complications   Post-procedure details: wound care instructions given   AK  (ACTINIC KERATOSIS) L sup forehead x 1 Actinic keratoses are precancerous spots that appear secondary to cumulative UV radiation exposure/sun exposure over time. They are chronic with expected duration over 1 year. A portion of actinic keratoses will progress to squamous cell carcinoma of the skin. It is not possible to reliably predict which spots will progress to skin cancer and so treatment is recommended to prevent development of skin cancer.  Recommend daily broad spectrum sunscreen SPF 30+ to sun-exposed areas, reapply every 2 hours as needed.  Recommend staying in the shade or wearing long sleeves, sun glasses (UVA+UVB protection) and wide brim hats (4-inch brim around the entire circumference of the hat). Call for new or changing lesions. Destruction of lesion - L sup forehead x 1 Complexity: simple   Destruction method: cryotherapy   Informed consent: discussed and consent obtained   Timeout:  patient name, date of birth, surgical site, and procedure verified Lesion destroyed using liquid nitrogen: Yes   Region frozen until ice ball extended beyond lesion: Yes   Cryo cycles: 1 or 2. Outcome: patient tolerated procedure well with no complications   Post-procedure details: wound care instructions given   SEBORRHEIC KERATOSES    Return for TBSE, with Dr. Felipe Horton, next available.  Kerstin Peeling, RMA, am acting as scribe for Harris Liming, MD .   Documentation: I have reviewed the above documentation for accuracy and completeness, and I agree with the above.  Harris Liming, MD

## 2024-04-11 ENCOUNTER — Encounter: Payer: Self-pay | Admitting: Dermatology

## 2024-06-16 ENCOUNTER — Ambulatory Visit: Admitting: Dermatology

## 2024-06-16 ENCOUNTER — Encounter: Payer: Self-pay | Admitting: Dermatology

## 2024-06-16 DIAGNOSIS — Z1283 Encounter for screening for malignant neoplasm of skin: Secondary | ICD-10-CM | POA: Diagnosis not present

## 2024-06-16 DIAGNOSIS — D692 Other nonthrombocytopenic purpura: Secondary | ICD-10-CM

## 2024-06-16 DIAGNOSIS — L820 Inflamed seborrheic keratosis: Secondary | ICD-10-CM

## 2024-06-16 DIAGNOSIS — L578 Other skin changes due to chronic exposure to nonionizing radiation: Secondary | ICD-10-CM

## 2024-06-16 DIAGNOSIS — W908XXA Exposure to other nonionizing radiation, initial encounter: Secondary | ICD-10-CM | POA: Diagnosis not present

## 2024-06-16 DIAGNOSIS — L821 Other seborrheic keratosis: Secondary | ICD-10-CM | POA: Diagnosis not present

## 2024-06-16 DIAGNOSIS — D492 Neoplasm of unspecified behavior of bone, soft tissue, and skin: Secondary | ICD-10-CM | POA: Diagnosis not present

## 2024-06-16 DIAGNOSIS — D099 Carcinoma in situ, unspecified: Secondary | ICD-10-CM

## 2024-06-16 DIAGNOSIS — C44619 Basal cell carcinoma of skin of left upper limb, including shoulder: Secondary | ICD-10-CM

## 2024-06-16 DIAGNOSIS — D0462 Carcinoma in situ of skin of left upper limb, including shoulder: Secondary | ICD-10-CM

## 2024-06-16 DIAGNOSIS — L570 Actinic keratosis: Secondary | ICD-10-CM

## 2024-06-16 DIAGNOSIS — D1801 Hemangioma of skin and subcutaneous tissue: Secondary | ICD-10-CM

## 2024-06-16 DIAGNOSIS — C4491 Basal cell carcinoma of skin, unspecified: Secondary | ICD-10-CM

## 2024-06-16 DIAGNOSIS — D0439 Carcinoma in situ of skin of other parts of face: Secondary | ICD-10-CM

## 2024-06-16 DIAGNOSIS — D229 Melanocytic nevi, unspecified: Secondary | ICD-10-CM

## 2024-06-16 DIAGNOSIS — L814 Other melanin hyperpigmentation: Secondary | ICD-10-CM

## 2024-06-16 HISTORY — DX: Carcinoma in situ, unspecified: D09.9

## 2024-06-16 HISTORY — DX: Basal cell carcinoma of skin, unspecified: C44.91

## 2024-06-16 NOTE — Progress Notes (Signed)
 Follow-Up Visit   Subjective  Gary Whitehead is a 73 y.o. male who presents for the following: Skin Cancer Screening and Full Body Skin Exam, hx of Aks, ISK R temple did not resolve from LN2 04/07/24  The patient presents for Total-Body Skin Exam (TBSE) for skin cancer screening and mole check. The patient has spots, moles and lesions to be evaluated, some may be new or changing and the patient may have concern these could be cancer.    The following portions of the chart were reviewed this encounter and updated as appropriate: medications, allergies, medical history  Review of Systems:  No other skin or systemic complaints except as noted in HPI or Assessment and Plan.  Objective  Well appearing patient in no apparent distress; mood and affect are within normal limits.  A full examination was performed including scalp, head, eyes, ears, nose, lips, neck, chest, axillae, abdomen, back, buttocks, bilateral upper extremities, bilateral lower extremities, hands, feet, fingers, toes, fingernails, and toenails. All findings within normal limits unless otherwise noted below.   Relevant physical exam findings are noted in the Assessment and Plan.  R temple x 1 Residual stuck on waxy pap with erythema R temple  Vertex scalp 12.26mm pigmented patch  L forehead 6.59mm pink to brown macule  Left Forearm 9.71mm brown scaly pap  L nasal dorsum 1.5 x 1.5cm keratotic skin colored plaque  L post shoulder 9.108mm pink pap with telangiectasias  R preauricular x 1, R mid helix x 1, L tragus x 1, L central cheek x 1, L jawline x 1, L preauricular x 1, L periorbital cheek x 2 (8) Pink scaly macules  Assessment & Plan   SKIN CANCER SCREENING PERFORMED TODAY.  ACTINIC DAMAGE - Chronic condition, secondary to cumulative UV/sun exposure - diffuse scaly erythematous macules with underlying dyspigmentation - Recommend daily broad spectrum sunscreen SPF 30+ to sun-exposed areas, reapply every 2  hours as needed.  - Staying in the shade or wearing long sleeves, sun glasses (UVA+UVB protection) and wide brim hats (4-inch brim around the entire circumference of the hat) are also recommended for sun protection.  - Call for new or changing lesions.  LENTIGINES, SEBORRHEIC KERATOSES, HEMANGIOMAS - Benign normal skin lesions - Benign-appearing - Call for any changes  MELANOCYTIC NEVI - Tan-brown and/or pink-flesh-colored symmetric macules and papules - Benign appearing on exam today - Observation - Call clinic for new or changing moles - Recommend daily use of broad spectrum spf 30+ sunscreen to sun-exposed areas.   PURPURA R zygoma Exam: 4.73mm purpuric macule  Treatment Plan: Observe, RTC if does not resolve  INFLAMED SEBORRHEIC KERATOSIS R temple x 1 Symptomatic, irritating, patient would like treated. Destruction of lesion - R temple x 1 Complexity: simple   Destruction method: cryotherapy   Informed consent: discussed and consent obtained   Timeout:  patient name, date of birth, surgical site, and procedure verified Lesion destroyed using liquid nitrogen: Yes   Region frozen until ice ball extended beyond lesion: Yes   Cryo cycles: 1 or 2. Outcome: patient tolerated procedure well with no complications   Post-procedure details: wound care instructions given   NEOPLASM OF SKIN (5) Vertex scalp Skin / nail biopsy Type of biopsy: tangential   Informed consent: discussed and consent obtained   Timeout: patient name, date of birth, surgical site, and procedure verified   Procedure prep:  Patient was prepped and draped in usual sterile fashion Prep type:  Isopropyl alcohol Anesthesia: the lesion was  anesthetized in a standard fashion   Anesthetic:  1% lidocaine  w/ epinephrine 1-100,000 buffered w/ 8.4% NaHCO3 Instrument used: DermaBlade   Hemostasis achieved with: pressure and aluminum chloride   Outcome: patient tolerated procedure well   Post-procedure details:  sterile dressing applied and wound care instructions given   Dressing type: bandage and bacitracin   Specimen 1 - Surgical pathology Differential Diagnosis: Lentigo vs Lentigo Maligna  Check Margins: No 12.81mm pigmented patch L forehead Skin / nail biopsy Type of biopsy: tangential   Informed consent: discussed and consent obtained   Timeout: patient name, date of birth, surgical site, and procedure verified   Procedure prep:  Patient was prepped and draped in usual sterile fashion Prep type:  Isopropyl alcohol Anesthesia: the lesion was anesthetized in a standard fashion   Anesthetic:  1% lidocaine  w/ epinephrine 1-100,000 buffered w/ 8.4% NaHCO3 Instrument used: DermaBlade   Hemostasis achieved with: pressure and aluminum chloride   Outcome: patient tolerated procedure well   Post-procedure details: sterile dressing applied and wound care instructions given   Dressing type: bandage and bacitracin   Specimen 2 - Surgical pathology Differential Diagnosis: AK vs Lentigo vs SCC vs Lentigo Malinga  Check Margins: No 6.91mm pink to brown macule Left Forearm Skin / nail biopsy Type of biopsy: tangential   Informed consent: discussed and consent obtained   Timeout: patient name, date of birth, surgical site, and procedure verified   Procedure prep:  Patient was prepped and draped in usual sterile fashion Prep type:  Isopropyl alcohol Anesthesia: the lesion was anesthetized in a standard fashion   Anesthetic:  1% lidocaine  w/ epinephrine 1-100,000 buffered w/ 8.4% NaHCO3 Instrument used: DermaBlade   Hemostasis achieved with: pressure and aluminum chloride   Outcome: patient tolerated procedure well   Post-procedure details: sterile dressing applied and wound care instructions given   Dressing type: bandage and bacitracin   Specimen 3 - Surgical pathology Differential Diagnosis: SK vs AK vs SCC  Check Margins: No 9.44mm brown scaly pap L nasal dorsum Skin / nail biopsy Type of  biopsy: tangential   Informed consent: discussed and consent obtained   Timeout: patient name, date of birth, surgical site, and procedure verified   Procedure prep:  Patient was prepped and draped in usual sterile fashion Prep type:  Isopropyl alcohol Anesthesia: the lesion was anesthetized in a standard fashion   Anesthetic:  1% lidocaine  w/ epinephrine 1-100,000 buffered w/ 8.4% NaHCO3 Instrument used: DermaBlade   Hemostasis achieved with: pressure and aluminum chloride   Outcome: patient tolerated procedure well   Post-procedure details: sterile dressing applied and wound care instructions given   Dressing type: bandage and petrolatum   Specimen 4 - Surgical pathology Differential Diagnosis: AK vs SCC vs Rosacea vs Microcystic adnexal carcinoma  Check Margins: No 1.5 x 1.5cm keratotic skin colored plaque L post shoulder Skin / nail biopsy Type of biopsy: tangential   Informed consent: discussed and consent obtained   Timeout: patient name, date of birth, surgical site, and procedure verified   Procedure prep:  Patient was prepped and draped in usual sterile fashion Prep type:  Isopropyl alcohol Anesthesia: the lesion was anesthetized in a standard fashion   Anesthetic:  1% lidocaine  w/ epinephrine 1-100,000 buffered w/ 8.4% NaHCO3 Instrument used: DermaBlade   Hemostasis achieved with: pressure and aluminum chloride   Outcome: patient tolerated procedure well   Post-procedure details: sterile dressing applied and wound care instructions given   Dressing type: bandage and bacitracin   Specimen  5 - Surgical pathology Differential Diagnosis: BCC vs other  Check Margins: No 9.55mm pink pap with telangiectasias AK (ACTINIC KERATOSIS) (8) R preauricular x 1, R mid helix x 1, L tragus x 1, L central cheek x 1, L jawline x 1, L preauricular x 1, L periorbital cheek x 2 (8) Actinic keratoses are precancerous spots that appear secondary to cumulative UV radiation exposure/sun exposure  over time. They are chronic with expected duration over 1 year. A portion of actinic keratoses will progress to squamous cell carcinoma of the skin. It is not possible to reliably predict which spots will progress to skin cancer and so treatment is recommended to prevent development of skin cancer.  Recommend daily broad spectrum sunscreen SPF 30+ to sun-exposed areas, reapply every 2 hours as needed.  Recommend staying in the shade or wearing long sleeves, sun glasses (UVA+UVB protection) and wide brim hats (4-inch brim around the entire circumference of the hat). Call for new or changing lesions. Destruction of lesion - R preauricular x 1, R mid helix x 1, L tragus x 1, L central cheek x 1, L jawline x 1, L preauricular x 1, L periorbital cheek x 2 (8) Complexity: simple   Destruction method: cryotherapy   Informed consent: discussed and consent obtained   Timeout:  patient name, date of birth, surgical site, and procedure verified Lesion destroyed using liquid nitrogen: Yes   Region frozen until ice ball extended beyond lesion: Yes   Cryo cycles: 1 or 2. Outcome: patient tolerated procedure well with no complications   Post-procedure details: wound care instructions given   MULTIPLE BENIGN NEVI   LENTIGINES   ACTINIC ELASTOSIS   SEBORRHEIC KERATOSES   CHERRY ANGIOMA   SOLAR PURPURA (HCC)    Return in about 6 months (around 12/16/2024) for TBSE, Hx of AKs.  I, Grayce Saunas, RMA, am acting as scribe for Boneta Sharps, MD .   Documentation: I have reviewed the above documentation for accuracy and completeness, and I agree with the above.  Boneta Sharps, MD

## 2024-06-16 NOTE — Patient Instructions (Addendum)

## 2024-06-19 LAB — SURGICAL PATHOLOGY

## 2024-06-22 ENCOUNTER — Ambulatory Visit: Payer: Self-pay | Admitting: Dermatology

## 2024-06-23 ENCOUNTER — Encounter: Payer: Self-pay | Admitting: Dermatology

## 2024-06-23 MED ORDER — FLUOROURACIL 5 % EX CREA: TOPICAL_CREAM | Freq: Two times a day (BID) | CUTANEOUS | 2 refills | Status: DC

## 2024-06-23 NOTE — Telephone Encounter (Signed)
 Spoke with patient and reviewed pathology results with him. He would like to treat SCCis x 2 with 5FU/calcipotriene. He asked me to call his wife and go over results with her and have her schedule his surgery appt. Patient scheduled for surgery 07/08/24 at 8:30  am, 5FU/calcipotriene sent to [place].  Lonell RAMAN., RMA

## 2024-06-23 NOTE — Telephone Encounter (Signed)
 ----- Message from Boneta Sharps sent at 06/22/2024  8:43 PM EDT ----- Diagnosis 1. Skin, vertex scalp :       PIGMENTED SEBORRHEIC KERATOSIS, EARLY        2. Skin, L forehead :       SOLAR LENTIGO AND PIGMENTED SEBORRHEIC KERATOSIS, EARLY        3. Skin, left forearm :       SQUAMOUS CELL CARCINOMA IN SITU, HYPERTROPHIC, BASE INVOLVED        4. Skin, L nasal dorsum :       SQUAMOUS CELL CARCINOMA IN SITU        5. Skin, L post shoulder :       BASAL CELL CARCINOMA, NODULAR PATTERN    Please call with diagnosis and message me with patient's decision on treatment.  Scalp: biopsy shows benign thickening of top layer of skin. No treatment needed L forehead: biopsy shows benign sunspot and benign thickening of top layer of skin. No treatment needed.  3. L forearm: Biopsy shows a squamous cell skin cancer limited to the top layer of skin. This means it is an early cancer and has not spread. However, it has the potential to spread beyond the skin  and threaten your health, so we recommend treating it.   Treatment option 1: a cream (fluorouracil and calcipotriene) that helps your immune system clear the skin cancer. It will cause redness and irritation. Wait two weeks after the biopsy to start  applying the cream. Apply the cream twice per day until the redness and irritation develop (usually occurs by day 7), then stop and allow it to heal. We will recheck the area in 2 months to ensure  the cancer is gone. The cream is $45 plus shipping and will be mailed to you from a low cost compounding pharmacy.  Treatment option 2: you return for a brief appointment where I perform electrodesiccation and curettage [facility]). This involves three rounds of scraping and burning to destroy the skin cancer. It has  about an 85% cure rate and leaves a round wound slightly larger than the skin cancer and leaves a round white scar. No additional pathology is done. If the skin cancer comes back, we would need  to do  a surgery to remove it.   4. L nose: Biopsy shows a squamous cell skin cancer limited to the top layer of skin. This means it is an early cancer and has not spread. However, it has the potential to spread beyond the skin and  threaten your health, so we recommend treating it.   Treatment option 1: a cream (fluorouracil and calcipotriene) that helps your immune system clear the skin cancer. It will cause redness and irritation. Wait two weeks after the biopsy to start  applying the cream. Apply the cream twice per day until the redness and irritation develop (usually occurs by day 7), then stop and allow it to heal. We will recheck the area in 2 months to ensure  the cancer is gone. The cream is $45 plus shipping and will be mailed to you from a low cost compounding pharmacy.  Treatment option 2: Mohs surgery, which involves cutting out right around the skin cancer and then checking under the microscope on the same day to ensure the whole skin cancer is out. If there is  more cancer remaining, the surgeon will repeat the process until it is fully cleared. The cure rate is about 98-99%. It is done at  another office outside of [place] ([place], [place], or  [place]). Once the Mohs surgeon confirms the skin cancer is out, they will discuss the options to repair or heal the area. You must take it easy for about two weeks after surgery (no lifting over  10-15 lbs, avoid activity to get your heart rate and blood pressure up).  5. L shoulder: biopsy shows basal cell skin cancer in second layer of skin.  Treatment: Recommend excision ----- Message ----- From: Interface, Lab In Three Zero One Sent: 06/19/2024   2:28 PM EDT To: Boneta Sharps, MD

## 2024-07-08 ENCOUNTER — Encounter: Admitting: Dermatology

## 2024-07-22 ENCOUNTER — Ambulatory Visit: Admitting: Dermatology

## 2024-07-22 ENCOUNTER — Encounter: Payer: Self-pay | Admitting: Dermatology

## 2024-07-22 DIAGNOSIS — C44619 Basal cell carcinoma of skin of left upper limb, including shoulder: Secondary | ICD-10-CM

## 2024-07-22 MED ORDER — MUPIROCIN 2 % EX OINT: 1.0000 | TOPICAL_OINTMENT | Freq: Every day | CUTANEOUS | 0 refills | Status: DC

## 2024-07-22 NOTE — Progress Notes (Signed)
   Follow-Up Visit   Subjective  Gary Whitehead is a 73 y.o. male who presents for the following: Excision of BX proven BCC at left posterior shoulder  The following portions of the chart were reviewed this encounter and updated as appropriate: medications, allergies, medical history  Review of Systems:  No other skin or systemic complaints except as noted in HPI or Assessment and Plan.  Objective  Well appearing patient in no apparent distress; mood and affect are within normal limits.  A focused examination was performed of the following areas: Left arm and shoulder Relevant physical exam findings are noted in the Assessment and Plan.   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 Skin excision  Excision method:  elliptical Lesion length (cm):  0.8 Margin per side (cm):  0.4 Total excision diameter (cm):  1.6 Informed consent: discussed and consent obtained   Timeout: patient name, date of birth, surgical site, and procedure verified   Procedure prep:  Patient was prepped and draped in usual sterile fashion Prep type:  Chlorhexidine  Anesthesia: the lesion was anesthetized in a standard fashion   Anesthetic:  1% lidocaine  w/ epinephrine 1-100,000 buffered w/ 8.4% NaHCO3 (12 cc) Instrument used: #15 blade   Hemostasis achieved with: suture, pressure and electrodesiccation   Outcome: patient tolerated procedure well with no complications   Additional details:  Superior tag  Skin repair Complexity:  Intermediate Final length (cm):  4.7 Informed consent: discussed and consent obtained   Timeout: patient name, date of birth, surgical site, and procedure verified   Procedure prep:  Patient was prepped and draped in usual sterile fashion Prep type:  Chlorhexidine  Anesthesia: the lesion was anesthetized in a standard fashion   Anesthetic:  1% lidocaine  w/  epinephrine 1-100,000 buffered w/ 8.4% NaHCO3 Reason for type of repair: reduce tension to allow closure, reduce the risk of dehiscence, infection, and necrosis, reduce subcutaneous dead space and avoid a hematoma, allow closure of the large defect and preserve normal anatomy   Undermining: edges could be approximated without difficulty   Subcutaneous layers (deep stitches):  Suture size:  3-0 Suture type: Monocryl (poliglecaprone 25)   Stitches:  Buried vertical mattress Fine/surface layer approximation (top stitches):  Suture size:  4-0 Suture type: Prolene (polypropylene)   Stitches comment:  Running locked Suture removal (days):  7 Hemostasis achieved with: suture, pressure and electrodesiccation Outcome: patient tolerated procedure well with no complications   Post-procedure details: sterile dressing applied and wound care instructions given   Dressing type: petrolatum, bandage and pressure dressing    Specimen 1 - Surgical pathology Differential Diagnosis: BX proven BCC  Check Margins: yes [id number] Superior tag   Return in about 1 week (around 07/29/2024) for Suture Removal.  LILLETTE Lonell Drones, RMA, am acting as scribe for Boneta Sharps, MD .   Documentation: I have reviewed the above documentation for accuracy and completeness, and I agree with the above.  Boneta Sharps, MD

## 2024-07-22 NOTE — Patient Instructions (Signed)

## 2024-07-23 ENCOUNTER — Telehealth: Payer: Self-pay

## 2024-07-23 NOTE — Telephone Encounter (Signed)
 Called patient and he is doing well following yesterday's excision with no concerns. Keep SR appt as scheduled. Lonell RAMAN., RMA

## 2024-07-27 LAB — SURGICAL PATHOLOGY

## 2024-07-28 ENCOUNTER — Ambulatory Visit: Payer: Self-pay | Admitting: Dermatology

## 2024-07-30 ENCOUNTER — Encounter: Payer: Self-pay | Admitting: Dermatology

## 2024-07-30 ENCOUNTER — Ambulatory Visit: Admitting: Dermatology

## 2024-07-30 DIAGNOSIS — Z4802 Encounter for removal of sutures: Secondary | ICD-10-CM

## 2024-07-30 DIAGNOSIS — Z5189 Encounter for other specified aftercare: Secondary | ICD-10-CM

## 2024-07-30 NOTE — Progress Notes (Signed)
   Follow-Up Visit   Subjective  Gary Whitehead is a 73 y.o. male who presents for the following: Suture removal  Pathology showed margins free basal cell carcinoma of the L post shoulder  The following portions of the chart were reviewed this encounter and updated as appropriate: medications, allergies, medical history  Review of Systems:  No other skin or systemic complaints except as noted in HPI or Assessment and Plan.  Objective  Well appearing patient in no apparent distress; mood and affect are within normal limits.  Areas Examined: The back  Relevant physical exam findings are noted in the Assessment and Plan.    Assessment & Plan   VISIT FOR WOUND CHECK   ENCOUNTER FOR REMOVAL OF SUTURES   Encounter for Removal of Sutures - Incision site is clean, dry and intact. - Wound cleansed, sutures removed, wound cleansed Mupirocin  2% ointment applied and covered with a bandage. Patient should leave on until tomorrow, then no further wound care is needed. - Discussed pathology results showing margins free basal cell carcinoma - Scars remodel for a full year. - Once steri-strips fall off, patient can apply over-the-counter silicone scar cream once to twice a day to help with scar remodeling if desired. - Patient advised to call with any concerns or if they notice any new or changing lesions.  Return for appointment as scheduled.  LILLETTE Rosina Mayans, CMA, am acting as scribe for Boneta Sharps, MD .   Documentation: I have reviewed the above documentation for accuracy and completeness, and I agree with the above.  Boneta Sharps, MD

## 2024-07-30 NOTE — Patient Instructions (Signed)

## 2024-12-25 ENCOUNTER — Emergency Department

## 2024-12-25 ENCOUNTER — Other Ambulatory Visit: Payer: Self-pay

## 2024-12-25 ENCOUNTER — Emergency Department: Admission: EM | Admit: 2024-12-25 | Discharge: 2024-12-26 | Disposition: A | Discharge status: Home / Self Care

## 2024-12-25 DIAGNOSIS — J069 Acute upper respiratory infection, unspecified: Secondary | ICD-10-CM | POA: Insufficient documentation

## 2024-12-25 DIAGNOSIS — J449 Chronic obstructive pulmonary disease, unspecified: Secondary | ICD-10-CM | POA: Diagnosis not present

## 2024-12-25 DIAGNOSIS — F17200 Nicotine dependence, unspecified, uncomplicated: Secondary | ICD-10-CM | POA: Diagnosis not present

## 2024-12-25 DIAGNOSIS — D72829 Elevated white blood cell count, unspecified: Secondary | ICD-10-CM | POA: Insufficient documentation

## 2024-12-25 DIAGNOSIS — R059 Cough, unspecified: Secondary | ICD-10-CM | POA: Diagnosis present

## 2024-12-25 DIAGNOSIS — E119 Type 2 diabetes mellitus without complications: Secondary | ICD-10-CM | POA: Insufficient documentation

## 2024-12-25 DIAGNOSIS — J9801 Acute bronchospasm: Secondary | ICD-10-CM | POA: Insufficient documentation

## 2024-12-25 LAB — BASIC METABOLIC PANEL WITH GFR
Anion gap: 11 (ref 5–15)
BUN: 20 mg/dL (ref 8–23)
CO2: 26 mmol/L (ref 22–32)
Calcium: 9.5 mg/dL (ref 8.9–10.3)
Chloride: 99 mmol/L (ref 98–111)
Creatinine, Ser: 0.87 mg/dL (ref 0.61–1.24)
GFR, Estimated: >60 mL/min
Glucose, Bld: 240 mg/dL — ABNORMAL HIGH (ref 70–99)
Potassium: 4.1 mmol/L (ref 3.5–5.1)
Sodium: 136 mmol/L (ref 135–145)

## 2024-12-25 LAB — CBC
HCT: 43.1 % (ref 39.0–52.0)
Hemoglobin: 14.1 g/dL (ref 13.0–17.0)
MCH: 28.1 pg (ref 26.0–34.0)
MCHC: 32.7 g/dL (ref 30.0–36.0)
MCV: 85.9 fL (ref 80.0–100.0)
Platelets: 327 K/uL (ref 150–400)
RBC: 5.02 MIL/uL (ref 4.22–5.81)
RDW: 13.3 % (ref 11.5–15.5)
WBC: 15.3 K/uL — ABNORMAL HIGH (ref 4.0–10.5)
nRBC: 0 % (ref 0.0–0.2)

## 2024-12-25 MED ORDER — SODIUM CHLORIDE 0.9 % IV BOLUS
1000.0000 mL | Freq: Once | INTRAVENOUS | Status: AC
  Administered 2024-12-25: 1000 mL via INTRAVENOUS

## 2024-12-25 MED ORDER — KETOROLAC TROMETHAMINE 15 MG/ML IJ SOLN
15.0000 mg | Freq: Once | INTRAMUSCULAR | Status: AC
  Administered 2024-12-25: 15 mg via INTRAVENOUS
  Filled 2024-12-25: qty 1

## 2024-12-25 MED ORDER — IPRATROPIUM-ALBUTEROL 0.5-2.5 (3) MG/3ML IN SOLN
3.0000 mL | Freq: Once | RESPIRATORY_TRACT | Status: AC
  Administered 2024-12-25: 3 mL via RESPIRATORY_TRACT
  Filled 2024-12-25: qty 3

## 2024-12-25 NOTE — ED Provider Notes (Signed)
 "  [facility] Provider Note    Event Date/Time   First MD Initiated Contact with Patient 12/25/24 2247     (approximate)   History   Shortness of Breath   HPI  Gary Whitehead is a 74 y.o. male  COPD, DMII, with ongoing smoking not followed by a primary care physician who presents with 2 days of progressively worsening shortness of breath and cough.  Patient has had chills at home but has not measured any fevers.  Denies any chest pain.  He states that he does not have a nebulizing machine at home.  Denies any abdominal pain nausea or vomiting.  His wife has congestion as well.      Physical Exam   Triage Vital Signs: ED Triage Vitals [12/25/24 2204]  Encounter Vitals Group     BP (!) 166/68     Girls Systolic BP Percentile      Girls Diastolic BP Percentile      Boys Systolic BP Percentile      Boys Diastolic BP Percentile      Pulse Rate (!) 117     Resp 18     Temp 98.9 F (37.2 C)     Temp Source Oral     SpO2 94 %     Weight 198 lb (89.8 kg)     Height 5' 11 (1.803 m)     Head Circumference      Peak Flow      Pain Score 0     Pain Loc      Pain Education      Exclude from Growth Chart     Most recent vital signs: Vitals:   12/26/24 0100 12/26/24 0117  BP: 135/61   Pulse: 91   Resp: 18   Temp:  98.1 F (36.7 C)  SpO2: 96%     Nursing Triage Note reviewed. Vital signs reviewed and patients oxygen saturation is normoxic  General: Patient is well nourished, well developed, awake and alert, resting comfortably in no acute distress Head: Normocephalic and atraumatic Eyes: Normal inspection, extraocular muscles intact, no conjunctival pallor Ear, nose, throat: Normal external exam Neck: Normal range of motion Respiratory: Patient is in no respiratory distress, lungs mild wheezes and rhonchi Cardiovascular: Patient is not tachycardic, RRR without murmur appreciated GI: Abd SNT with no guarding or rebound  Back: Normal  inspection of the back with good strength and range of motion throughout all ext Extremities: pulses intact with good cap refills, no LE pitting edema or calf tenderness Neuro: The patient is alert and oriented to person, place, and time, appropriately conversive, with 5/5 bilat UE/LE strength, no gross motor or sensory defects noted. Coordination appears to be adequate. Skin: Warm, dry, and intact Psych: normal mood and affect, no SI or HI  ED Results / Procedures / Treatments   Labs (all labs ordered are listed, but only abnormal results are displayed) Labs Reviewed  BASIC METABOLIC PANEL WITH GFR - Abnormal; Notable for the following components:      Result Value   Glucose, Bld 240 (*)    All other components within normal limits  CBC - Abnormal; Notable for the following components:   WBC 15.3 (*)    All other components within normal limits  RESP PANEL BY RT-PCR (RSV, FLU A&B, COVID)  RVPGX2     EKG EKG and rhythm strip are interpreted by myself:   EKG: tachycardic sinus rhythm] at heart rate of 99, normal  QRS duration, QTc 450, nonspecific ST segments and T waves no ectopy EKG not consistent with Acute STEMI Rhythm strip: tachycardic sinus rhythm in lead II   RADIOLOGY Xray chest: Appears to have opacities consistent with pneumonia on my independent review interpretation but radiologist reads this as no acute abnormality    PROCEDURES:  Critical Care performed: No  Procedures   MEDICATIONS ORDERED IN ED: Medications  ipratropium-albuterol  (DUONEB) 0.5-2.5 (3) MG/3ML nebulizer solution 3 mL (3 mLs Nebulization Given 12/25/24 2332)  ketorolac  (TORADOL ) 15 MG/ML injection 15 mg (15 mg Intravenous Given 12/25/24 2332)  sodium chloride  0.9 % bolus 1,000 mL (0 mLs Intravenous Stopped 12/26/24 0047)  cefTRIAXone  (ROCEPHIN ) 2 g in sodium chloride  0.9 % 100 mL IVPB (0 g Intravenous Stopped 12/26/24 0047)  azithromycin  (ZITHROMAX ) 500 mg in sodium chloride  0.9 % 250 mL IVPB (0 mg  Intravenous Stopped 12/26/24 0204)     IMPRESSION / MDM / ASSESSMENT AND PLAN / ED COURSE                                Differential diagnosis includes, but is not limited to, bronchospasm, pneumonia, upper respiratory infection, electrolyte derangement  ED course: Patient presents and does have evidence of bronchospasm on presentation.  He was given a DuoNeb with resolve of this.  He does have a significant leukocytosis of 15.3 today but no anemia.  His glucose was elevated but he had no other electrolyte derangements and his anion gap was only 11.  Given his age and 48 hours of symptoms a COVID swab with influenza was sent which was negative.  I did make the decision to treat him for community-acquired pneumonia and he received a dose of ceftriaxone  and azithromycin  here.  Patient ambulated and felt improved and felt comfortable returning home.  I did send him with an albuterol  inhaler, cefdinir and azithromycin  he was encouraged to follow-up with her primary care physician   Clinical Course as of 12/26/24 0440  Fri Dec 25, 2024  2318 DG Chest 2 View Negative [HD]  Sat Dec 26, 2024  0001 Notified by nurse that heart rate is now 20 however I do not see this documented yet [HD]  0141 Resp panel by RT-PCR (RSV, Flu A&B, Covid) Anterior Nasal Swab Negative [HD]  0144 Patient reassessed feels completely improved.  Feels comfortable returning home.  He will go on a course of azithromycin  and will send him with an albuterol  inhaler [HD]    Clinical Course User Index [HD] Nicholaus Rolland BRAVO, MD   At time of discharge there is no evidence of acute life, limb, vision, or fertility threat. Patient has stable vital signs, pain is well controlled, patient is ambulatory and p.o. tolerant.  Discharge instructions were completed using the EPIC system. I would refer you to those at this time. All warnings prescriptions follow-up etc. were discussed in detail with the patient. Patient indicates  understanding and is agreeable with this plan. All questions answered.  Patient is made aware that they may return to the emergency department for any worsening or new condition or for any other emergency.   -- Risk: 5 This patient has a high risk of morbidity due to further diagnostic testing or treatment. Rationale: This patients evaluation and management involve a high risk of morbidity due to the potential severity of presenting symptoms, need for diagnostic testing, and/or initiation of treatment that may require close monitoring. The differential includes  conditions with potential for significant deterioration or requiring escalation of care. Treatment decisions in the ED, including medication administration, procedural interventions, or disposition planning, reflect this level of risk. COPA: 5 The patient has the following acute or chronic illness/injury that poses a possible threat to life or bodily function: [X] : The patient has a potentially serious acute condition or an acute exacerbation of a chronic illness requiring urgent evaluation and management in the Emergency Department. The clinical presentation necessitates immediate consideration of life-threatening or function-threatening diagnoses, even if they are ultimately ruled out.   FINAL CLINICAL IMPRESSION(S) / ED DIAGNOSES   Final diagnoses:  Bronchospasm  Upper respiratory tract infection, unspecified type  Leukocytosis, unspecified type     Rx / DC Orders   ED Discharge Orders          Ordered    albuterol  (VENTOLIN  HFA) 108 (90 Base) MCG/ACT inhaler  Every 6 hours PRN        12/26/24 0146    azithromycin  (ZITHROMAX  Z-PAK) 250 MG tablet        12/26/24 0146             Note:  This document was prepared using Dragon voice recognition software and may include unintentional dictation errors.   Nicholaus Rolland BRAVO, MD 12/26/24 [phone]  "

## 2024-12-25 NOTE — ED Triage Notes (Addendum)
 Pt presents for SOB. Started today while laying in bed. Denies known cardiac or breathing issues. Endorsing productive cough (green). Endorsing sitting for long periods of time and smokes. Denies pain.

## 2024-12-26 DIAGNOSIS — J9801 Acute bronchospasm: Secondary | ICD-10-CM | POA: Diagnosis not present

## 2024-12-26 LAB — RESP PANEL BY RT-PCR (RSV, FLU A&B, COVID)  RVPGX2
Influenza A by PCR: NEGATIVE
Influenza B by PCR: NEGATIVE
Resp Syncytial Virus by PCR: NEGATIVE
SARS Coronavirus 2 by RT PCR: NEGATIVE

## 2024-12-26 MED ORDER — SODIUM CHLORIDE 0.9 % IV SOLN
500.0000 mg | Freq: Once | INTRAVENOUS | Status: AC
  Administered 2024-12-26: 500 mg via INTRAVENOUS
  Filled 2024-12-26: qty 5

## 2024-12-26 MED ORDER — SODIUM CHLORIDE 0.9 % IV SOLN
2.0000 g | Freq: Once | INTRAVENOUS | Status: AC
  Administered 2024-12-26: 2 g via INTRAVENOUS
  Filled 2024-12-26: qty 20

## 2024-12-26 MED ORDER — AZITHROMYCIN 250 MG PO TABS: ORAL_TABLET | ORAL | 0 refills | Status: AC

## 2024-12-26 MED ORDER — ALBUTEROL SULFATE HFA 108 (90 BASE) MCG/ACT IN AERS: 2.0000 | INHALATION_SPRAY | Freq: Four times a day (QID) | RESPIRATORY_TRACT | 2 refills | Status: DC | PRN

## 2024-12-26 NOTE — Discharge Instructions (Signed)
 Use your albuterol  inhaler every 4-6 hours while awake.  Use over-the-counter ibuprofen  or Tylenol  as directed.  Your next dose of azithromycin  will be due tomorrow morning.  Return with any acutely worsening symptoms and follow-up with your primary care physician. -- RETURN PRECAUTIONS & AFTERCARE: (ENGLISH) RETURN PRECAUTIONS: Return immediately to the emergency department or see/call your doctor if you feel worse, weak or have changes in speech or vision, are short of breath, have fever, vomiting, pain, bleeding or dark stool, trouble urinating or any new issues. Return here or see/call your doctor if not improving as expected for your suspected condition. FOLLOW-UP CARE: Call your doctor and/or any doctors we referred you to for more advice and to make an appointment. Do this today, tomorrow or after the weekend. Some doctors only take PPO insurance so if you have HMO insurance you may want to contact your HMO or your regular doctor for referral to a specialist within your plan. Either way tell the doctor's office that it was a referral from the emergency department so you get the soonest possible appointment.  YOUR TEST RESULTS: Take result reports of any blood or urine tests, imaging tests and EKG's to your doctor and any referral doctor. Have any abnormal tests repeated. Your doctor or a referral doctor can let you know when this should be done. Also make sure your doctor contacts this hospital to get any test results that are not currently available such as cultures or special tests for infection and final imaging reports, which are often not available at the time you leave the ER but which may list additional important findings that are not documented on the preliminary report. BLOOD PRESSURE: If your blood pressure was greater than 120/80 have your blood pressure rechecked within 1 to 2 weeks. MEDICATION SIDE EFFECTS: Do not drive, walk, bike, take the bus, etc. if you have received or are being  prescribed any sedating medications such as those for pain or anxiety or certain antihistamines like Benadryl. If you have been give one of these here get a taxi home or have a friend drive you home. Ask your pharmacist to counsel you on potential side effects of any new medication

## 2024-12-26 NOTE — ED Provider Notes (Incomplete)
 "  [facility] Provider Note    Event Date/Time   First MD Initiated Contact with Patient 12/25/24 2247     (approximate)   History   Shortness of Breath   HPI  Gary Whitehead is a 74 y.o. male  COPD, DMII, with ongoing smoking not followed by a primary care physician who presents with 2 days of progressively worsening shortness of breath and cough.       Physical Exam   Triage Vital Signs: ED Triage Vitals [12/25/24 2204]  Encounter Vitals Group     BP (!) 166/68     Girls Systolic BP Percentile      Girls Diastolic BP Percentile      Boys Systolic BP Percentile      Boys Diastolic BP Percentile      Pulse Rate (!) 117     Resp 18     Temp 98.9 F (37.2 C)     Temp Source Oral     SpO2 94 %     Weight 198 lb (89.8 kg)     Height 5' 11 (1.803 m)     Head Circumference      Peak Flow      Pain Score 0     Pain Loc      Pain Education      Exclude from Growth Chart     Most recent vital signs: Vitals:   12/25/24 2204  BP: (!) 166/68  Pulse: (!) 117  Resp: 18  Temp: 98.9 F (37.2 C)  SpO2: 94%    Nursing Triage Note reviewed. Vital signs reviewed and patients oxygen saturation is normoxic***  General: Patient is well nourished, well developed, awake and alert, resting comfortably in no acute distress Head: Normocephalic and atraumatic Eyes: Normal inspection, extraocular muscles intact, no conjunctival pallor Ear, nose, throat: Normal external exam Neck: Normal range of motion Respiratory: Patient is in no respiratory distress, lungs CTAB Cardiovascular: Patient is not tachycardic, RRR without murmur appreciated GI: Abd SNT with no guarding or rebound  Back: Normal inspection of the back with good strength and range of motion throughout all ext Extremities: pulses intact with good cap refills, no LE pitting edema or calf tenderness Neuro: The patient is alert and oriented to person, place, and time, appropriately conversive,  with 5/5 bilat UE/LE strength, no gross motor or sensory defects noted. Coordination appears to be adequate. Skin: Warm, dry, and intact Psych: normal mood and affect, no SI or HI  ED Results / Procedures / Treatments   Labs (all labs ordered are listed, but only abnormal results are displayed) Labs Reviewed  BASIC METABOLIC PANEL WITH GFR - Abnormal; Notable for the following components:      Result Value   Glucose, Bld 240 (*)    All other components within normal limits  CBC - Abnormal; Notable for the following components:   WBC 15.3 (*)    All other components within normal limits  RESP PANEL BY RT-PCR (RSV, FLU A&B, COVID)  RVPGX2     EKG   RADIOLOGY ***    PROCEDURES:  Critical Care performed: {CriticalCareYesNo:19197::Yes, see critical care procedure note(s),No}  Procedures   MEDICATIONS ORDERED IN ED: Medications  ipratropium-albuterol  (DUONEB) 0.5-2.5 (3) MG/3ML nebulizer solution 3 mL (3 mLs Nebulization Given 12/25/24 2332)  ketorolac (TORADOL) 15 MG/ML injection 15 mg (15 mg Intravenous Given 12/25/24 2332)  sodium chloride  0.9 % bolus 1,000 mL (1,000 mLs Intravenous New Bag/Given 12/25/24 2333)  IMPRESSION / MDM / ASSESSMENT AND PLAN / ED COURSE                                Differential diagnosis includes, but is not limited to, ***    ***   Clinical Course as of 12/26/24 0001  Fri Dec 25, 2024  2318 DG Chest 2 View Negative [HD]    Clinical Course User Index [HD] Nicholaus Rolland BRAVO, MD   -- Risk: 5 This patient has a high risk of morbidity due to further diagnostic testing or treatment. Rationale: This patients evaluation and management involve a high risk of morbidity due to the potential severity of presenting symptoms, need for diagnostic testing, and/or initiation of treatment that may require close monitoring. The differential includes conditions with potential for significant deterioration or requiring escalation of care.  Treatment decisions in the ED, including medication administration, procedural interventions, or disposition planning, reflect this level of risk. COPA: 5 The patient has the following acute or chronic illness/injury that poses a possible threat to life or bodily function: [X] : The patient has a potentially serious acute condition or an acute exacerbation of a chronic illness requiring urgent evaluation and management in the Emergency Department. The clinical presentation necessitates immediate consideration of life-threatening or function-threatening diagnoses, even if they are ultimately ruled out.   FINAL CLINICAL IMPRESSION(S) / ED DIAGNOSES   Final diagnoses:  None     Rx / DC Orders   ED Discharge Orders     None        Note:  This document was prepared using Dragon voice recognition software and may include unintentional dictation errors. "

## 2024-12-29 ENCOUNTER — Ambulatory Visit: Admitting: Dermatology

## 2024-12-29 ENCOUNTER — Encounter: Payer: Self-pay | Admitting: Dermatology

## 2024-12-29 DIAGNOSIS — Z85828 Personal history of other malignant neoplasm of skin: Secondary | ICD-10-CM

## 2024-12-29 DIAGNOSIS — D489 Neoplasm of uncertain behavior, unspecified: Secondary | ICD-10-CM | POA: Diagnosis not present

## 2024-12-29 DIAGNOSIS — L814 Other melanin hyperpigmentation: Secondary | ICD-10-CM | POA: Diagnosis not present

## 2024-12-29 DIAGNOSIS — L918 Other hypertrophic disorders of the skin: Secondary | ICD-10-CM | POA: Diagnosis not present

## 2024-12-29 DIAGNOSIS — D229 Melanocytic nevi, unspecified: Secondary | ICD-10-CM

## 2024-12-29 DIAGNOSIS — L570 Actinic keratosis: Secondary | ICD-10-CM

## 2024-12-29 DIAGNOSIS — W908XXA Exposure to other nonionizing radiation, initial encounter: Secondary | ICD-10-CM

## 2024-12-29 DIAGNOSIS — Z1283 Encounter for screening for malignant neoplasm of skin: Secondary | ICD-10-CM | POA: Diagnosis not present

## 2024-12-29 DIAGNOSIS — Z86007 Personal history of in-situ neoplasm of skin: Secondary | ICD-10-CM | POA: Diagnosis not present

## 2024-12-29 DIAGNOSIS — L821 Other seborrheic keratosis: Secondary | ICD-10-CM

## 2024-12-29 DIAGNOSIS — D1801 Hemangioma of skin and subcutaneous tissue: Secondary | ICD-10-CM

## 2024-12-29 DIAGNOSIS — D225 Melanocytic nevi of trunk: Secondary | ICD-10-CM

## 2024-12-29 DIAGNOSIS — L578 Other skin changes due to chronic exposure to nonionizing radiation: Secondary | ICD-10-CM

## 2024-12-29 DIAGNOSIS — D239 Other benign neoplasm of skin, unspecified: Secondary | ICD-10-CM

## 2024-12-29 HISTORY — DX: Other benign neoplasm of skin, unspecified: D23.9

## 2024-12-29 NOTE — Patient Instructions (Addendum)
 Wound Care Instructions  Cleanse wound gently with soap and water once a day then pat dry with clean gauze. Apply a thin coat of Petrolatum (petroleum jelly, Vaseline) over the wound (unless you have an allergy to this). We recommend that you use a new, sterile tube of Vaseline. Do not pick or remove scabs. Do not remove the yellow or white healing tissue from the base of the wound.  Cover the wound with fresh, clean, nonstick gauze and secure with paper tape. You may use Band-Aids in place of gauze and tape if the wound is small enough, but would recommend trimming much of the tape off as there is often too much. Sometimes Band-Aids can irritate the skin.  You should call the office for your biopsy report after 1 week if you have not already been contacted.  If you experience any problems, such as abnormal amounts of bleeding, swelling, significant bruising, significant pain, or evidence of infection, please call the office immediately.  FOR ADULT SURGERY PATIENTS: If you need something for pain relief you may take 1 extra strength Tylenol  (acetaminophen ) AND 2 Ibuprofen (200mg  each) together every 4 hours as needed for pain. (do not take these if you are allergic to them or if you have a reason you should not take them.) Typically, you may only need pain medication for 1 to 3 days.     Cryosurgery  Cryosurgery (freezing) uses liquid nitrogen to destroy certain types of skin lesions. Lowering the temperature of the lesion in a small area surrounding skin destroys the lesion. Immediately following cryosurgery, you will notice redness and swelling of the treatment area. Blistering or weeping may occur, lasting approximately one week which will then be followed by crusting. Most areas will heal completely in 10 to 14 days.  Wash the treated areas daily. Allow soap and water to run over the areas, but do not scrub. Should a scab or crust form, allow it to fall off on its own. Do not remove or  pick at it. Application of an ointment  and a bandage may make you feel more comfortable, but it is not necessary. Some people develop an allergy to Neosporin, so we recommend that Vaseline or  Aquaphor be used.  The cryotherapy site will be more sensitive than your surrounding skin. Keep it covered, and remember to apply sunscreen every day to all your sun exposed skin. A scar may remain which is lighter or pinker than your normal skin. Your body will continue to improve your scar for up to one year; however a light-colored scar may remain.  Infection following cryotherapy is rare. However if you are worried about the appearance of the treated area, contact your doctor. We have a physician on call at all times. If you have any concerns about the site, please call our clinic at [phone]   Due to recent changes in healthcare laws, you may see results of your pathology and/or laboratory studies on MyChart before the doctors have had a chance to review them. We understand that in some cases there may be results that are confusing or concerning to you. Please understand that not all results are received at the same time and often the doctors may need to interpret multiple results in order to provide you with the best plan of care or course of treatment. Therefore, we ask that you please give us  2 business days to thoroughly review all your results before contacting the office for clarification. Should we see a critical  lab result, you will be contacted sooner.   If You Need Anything After Your Visit  If you have any questions or concerns for your doctor, please call our main line at [phone] and press option 4 to reach your doctor's medical assistant. If no one answers, please leave a voicemail as directed and we will return your call as soon as possible. Messages left after 4 pm will be answered the following business day.   You may also send us  a message via MyChart. We typically respond to  MyChart messages within 1-2 business days.  For prescription refills, please ask your pharmacy to contact our office. Our fax number is [phone].  If you have an urgent issue when the clinic is closed that cannot wait until the next business day, you can page your doctor at the number below.    Please note that while we do our best to be available for urgent issues outside of office hours, we are not available 24/7.   If you have an urgent issue and are unable to reach us , you may choose to seek medical care at your doctor's office, retail clinic, urgent care center, or emergency room.  If you have a medical emergency, please immediately call 911 or go to the emergency department.  Pager Numbers  - Dr. Hester: [phone]  - Dr. Jackquline: [phone]  - Dr. Claudene: [phone]   - Dr. Raymund: [phone]  In the event of inclement weather, please call our main line at [phone] for an update on the status of any delays or closures.  Dermatology Medication Tips: Please keep the boxes that topical medications come in in order to help keep track of the instructions about where and how to use these. Pharmacies typically print the medication instructions only on the boxes and not directly on the medication tubes.   If your medication is too expensive, please contact our office at [phone] option 4 or send us  a message through MyChart.   We are unable to tell what your co-pay for medications will be in advance as this is different depending on your insurance coverage. However, we may be able to find a substitute medication at lower cost or fill out paperwork to get insurance to cover a needed medication.   If a prior authorization is required to get your medication covered by your insurance company, please allow us  1-2 business days to complete this process.  Drug prices often vary depending on where the prescription is filled and some pharmacies may offer cheaper  prices.  The website www.goodrx.com contains coupons for medications through different pharmacies. The prices here do not account for what the cost may be with help from insurance (it may be cheaper with your insurance), but the website can give you the price if you did not use any insurance.  - You can print the associated coupon and take it with your prescription to the pharmacy.  - You may also stop by our office during regular business hours and pick up a GoodRx coupon card.  - If you need your prescription sent electronically to a different pharmacy, notify our office through [facility] or by phone at [phone] option 4.     Si Usted Necesita Algo Despus de Su Visita  Tambin puede enviarnos un mensaje a travs de Clinical Cytogeneticist. Por lo general respondemos a los mensajes de MyChart en el transcurso de 1 a 2 das hbiles.  Para renovar recetas, por favor pida a su farmacia que se  ponga en contacto con nuestra oficina. Randi lakes de fax es [place] [phone].  Si tiene un asunto urgente cuando la clnica est cerrada y que no puede esperar hasta el siguiente da hbil, puede llamar/localizar a su doctor(a) al nmero que aparece a continuacin.   Por favor, tenga en cuenta que aunque hacemos todo lo posible para estar disponibles para asuntos urgentes fuera del horario de [place], no estamos disponibles las 24 horas del da, los 7 [address]  [phone] de la [place].   Si tiene un problema urgente y no puede comunicarse con nosotros, puede optar por buscar atencin mdica  en el consultorio de su doctor(a), en una clnica privada, en un centro de atencin urgente o en una sala de emergencias.  Si tiene engineer, drilling, por favor llame inmediatamente al 911 o vaya a la sala de emergencias.  Nmeros de bper  - Dr. Hester: [phone]  - Dra. Jackquline: [phone]  - Dr. Claudene: [phone]  - Dra. Kitts: [phone]  En caso de inclemencias del [place], por favor llame a nuestra  lnea principal al [phone] para una actualizacin sobre el estado de cualquier retraso o cierre.  Consejos para la medicacin en dermatologa: Por favor, guarde las cajas en las que vienen los medicamentos de uso tpico para ayudarle a seguir las instrucciones sobre dnde y cmo usarlos. Las farmacias generalmente imprimen las instrucciones del medicamento slo en las cajas y no directamente en los tubos del [place].   Si su medicamento es muy caro, por favor, pngase en contacto con landry rieger llamando al [phone] y presione la opcin 4 o envenos un mensaje a travs de Clinical Cytogeneticist.   No podemos decirle cul ser su copago por los medicamentos por adelantado ya que esto es diferente dependiendo de la cobertura de su seguro. Sin embargo, es posible que podamos encontrar un medicamento sustituto a audiological scientist un formulario para que el seguro cubra el medicamento que se considera necesario.   Si se requiere una autorizacin previa para que su compaa de seguros cubra su medicamento, por favor permtanos de 1 a 2 das hbiles para completar este proceso.  Los precios de los medicamentos varan con frecuencia dependiendo del environmental consultant de dnde se surte la receta y alguna farmacias pueden ofrecer precios ms baratos.  El sitio web www.goodrx.com tiene cupones para medicamentos de health and safety inspector. Los precios aqu no tienen en cuenta lo que podra costar con la ayuda del seguro (puede ser ms barato con su seguro), pero el sitio web puede darle el precio si no utiliz tourist information centre manager.  - Puede imprimir el cupn correspondiente y llevarlo con su receta a la farmacia.  - Tambin puede pasar por nuestra oficina durante el horario de atencin regular y education officer, museum una tarjeta de cupones de GoodRx.  - Si necesita que su receta se enve electrnicamente a una farmacia diferente, informe a nuestra oficina a travs de MyChart de [place] o por telfono llamando al [phone] y presione la  opcin 4.

## 2024-12-29 NOTE — Progress Notes (Signed)
 "  Follow-Up Visit   Subjective  Gary Whitehead is a 74 y.o. male who presents for the following: Skin Cancer Screening and Full Body Skin Exam Hx of SCCIS, BCC, and AK He states dry spot on right hand. Does not use products on the area.  Patient did use the 5FU/calcipotriene and has stated improvement within 6-7 days. Wife is with patient and contributes to history.   The patient presents for Total-Body Skin Exam (TBSE) for skin cancer screening and mole check. The patient has spots, moles and lesions to be evaluated, some may be new or changing and the patient may have concern these could be cancer.   The following portions of the chart were reviewed this encounter and updated as appropriate: medications, allergies, medical history  Review of Systems:  No other skin or systemic complaints except as noted in HPI or Assessment and Plan.  Objective  Well appearing patient in no apparent distress; mood and affect are within normal limits.  A full examination was performed including scalp, head, eyes, ears, nose, lips, neck, chest, axillae, abdomen, back, buttocks, bilateral upper extremities, bilateral lower extremities, hands, feet, fingers, toes, fingernails, and toenails. All findings within normal limits unless otherwise noted below.   Relevant physical exam findings are noted in the Assessment and Plan.  L vertex x2 (2) Erythematous thin papules/macules with gritty scale. Right Flank 5 mm regular brown macule   Assessment & Plan   SKIN CANCER SCREENING PERFORMED TODAY.  ACTINIC DAMAGE - Chronic condition, secondary to cumulative UV/sun exposure - diffuse scaly erythematous macules with underlying dyspigmentation - Recommend daily broad spectrum sunscreen SPF 30+ to sun-exposed areas, reapply every 2 hours as needed.  - Staying in the shade or wearing long sleeves, sun glasses (UVA+UVB protection) and wide brim hats (4-inch brim around the entire circumference of the hat)  are also recommended for sun protection.  - Call for new or changing lesions.  LENTIGINES, SEBORRHEIC KERATOSES, HEMANGIOMAS -  Seborrheic keratoses at right hand, right forearm Benign normal skin lesions - Benign-appearing - Call for any changes  MELANOCYTIC NEVI - Tan-brown and/or pink-flesh-colored symmetric macules and papules - Benign appearing on exam today - Observation - Call clinic for new or changing moles - Recommend daily use of broad spectrum spf 30+ sunscreen to sun-exposed areas.   Acrochordons (Skin Tags) - Fleshy, skin-colored pedunculated papules at neck.  - Benign appearing.  - Observe. - If desired, they can be removed with an in office procedure that is not covered by insurance. - Please call the clinic if you notice any new or changing lesions.    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  - Call if any new or changing lesions are noted between office visits    AK (ACTINIC KERATOSIS) (2) L vertex x2 (2) Actinic keratoses are precancerous spots that appear secondary to cumulative UV radiation exposure/sun exposure over time. They are chronic with expected duration over  1 year. A portion of actinic keratoses will progress to squamous cell carcinoma of the skin. It is not possible to reliably predict which spots will progress to skin cancer and so treatment is recommended to prevent development of skin  cancer.  Recommend daily broad spectrum sunscreen SPF 30+ to sun-exposed areas, reapply every 2 hours as needed.  Recommend staying in the shade or wearing long sleeves, sun glasses (UVA+UVB protection) and wide brim hats (4-inch brim around the entire circumference of the hat). Call for new or changing lesions. - Destruction of lesion - L vertex x2 (2) Complexity: simple   Destruction method: cryotherapy   Informed consent: discussed and consent obtained   Timeout:  patient name, date of birth, surgical site, and procedure verified Lesion destroyed using liquid nitrogen: Yes   Region frozen until ice ball extended beyond lesion: Yes   Cryo cycles: 1 or 2. Outcome: patient tolerated procedure well with no complications   Post-procedure details: wound care instructions given   Additional details:  Prior to procedure, discussed risks of blister formation, small wound, skin dyspigmentation, or rare scar following cryotherapy. Recommend Vaseline ointment to treated areas while healing.   NEOPLASM OF UNCERTAIN BEHAVIOR Right Flank - Skin / nail biopsy Type of biopsy: tangential   Informed consent: discussed and consent obtained   Timeout: patient name, date of birth, surgical site, and procedure verified   Procedure prep:  Patient was prepped and draped in usual sterile fashion Prep type:  Isopropyl alcohol Anesthesia: the lesion was anesthetized in a standard fashion   Anesthetic:  1% lidocaine  w/ epinephrine 1-100,000 buffered w/ 8.4% NaHCO3 Instrument used: DermaBlade   Hemostasis achieved with: pressure and aluminum chloride   Outcome: patient tolerated procedure well   Post-procedure details: sterile dressing applied and wound care instructions given   Dressing type: bandage and petrolatum    Specimen 1 - Surgical pathology Differential Diagnosis: dysplastic vs melanoma  Check Margins: No 5 mm regular brown macule LENTIGINES   MULTIPLE BENIGN NEVI   SEBORRHEIC  KERATOSES   ACTINIC ELASTOSIS   CHERRY ANGIOMA   ACROCHORDON   Return in about 6 months (around 06/28/2025) for TBSE, w/ Dr. Claudene.  IAlmetta Nora, RMA, am acting as scribe for Boneta Claudene, MD .   Documentation: I have reviewed the above documentation for accuracy and completeness, and I agree with the above.  Boneta Claudene, MD    "

## 2024-12-31 LAB — SURGICAL PATHOLOGY

## 2025-01-03 ENCOUNTER — Ambulatory Visit: Payer: Self-pay | Admitting: Dermatology

## 2025-01-04 ENCOUNTER — Encounter: Payer: Self-pay | Admitting: Dermatology

## 2025-01-04 NOTE — Telephone Encounter (Signed)
-----   Message from Boneta Sharps, MD sent at 01/03/2025  5:09 PM EST ----- Diagnosis: right flank :       DYSPLASTIC COMPOUND NEVUS WITH SEVERE ATYPIA, PERIPHERAL MARGIN INVOLVED, SEE       DESCRIPTION   Please call to share diagnosis and offer excision.   Explanation: Your biopsy shows an atypical mole. It looks unusual enough that it cannot be distinguished from an early melanoma skin cancer. The safest course of action is to ensure it is fully  removed with a surgery.  Treatment: you return for an hour long appointment where we perform a skin surgery. We numb the site of the skin cancer and a safety margin of normal skin around it. We remove the full thickness of  skin and close the wound with two layers of stitches. The sample is sent to the lab to check that the skin cancer was fully removed. Return one week later to have wound checked and surface stitches  removed. Surgical wound leaves a line scar. Risks include pain, infection, bleeding, thickened scar, wound dehiscence, recurrence

## 2025-01-04 NOTE — Telephone Encounter (Signed)
 Spoke with patient and advised of biopsy results. Patient scheduled for excision on 02/10/25 and voiced understanding to all.

## 2025-01-27 ENCOUNTER — Emergency Department

## 2025-01-27 ENCOUNTER — Other Ambulatory Visit: Payer: Self-pay

## 2025-01-27 ENCOUNTER — Inpatient Hospital Stay: Admission: EM | Admit: 2025-01-27 | Source: Home / Self Care | Attending: Family Medicine | Admitting: Family Medicine

## 2025-01-27 ENCOUNTER — Encounter: Payer: Self-pay | Admitting: Emergency Medicine

## 2025-01-27 DIAGNOSIS — R0602 Shortness of breath: Principal | ICD-10-CM

## 2025-01-27 DIAGNOSIS — A419 Sepsis, unspecified organism: Secondary | ICD-10-CM | POA: Diagnosis present

## 2025-01-27 DIAGNOSIS — I10 Essential (primary) hypertension: Secondary | ICD-10-CM | POA: Diagnosis not present

## 2025-01-27 DIAGNOSIS — J189 Pneumonia, unspecified organism: Secondary | ICD-10-CM

## 2025-01-27 DIAGNOSIS — R918 Other nonspecific abnormal finding of lung field: Secondary | ICD-10-CM

## 2025-01-27 LAB — LACTIC ACID, PLASMA: Lactic Acid, Venous: 2 mmol/L (ref 0.5–1.9)

## 2025-01-27 LAB — CBC
HCT: 43.3 % (ref 39.0–52.0)
Hemoglobin: 14.2 g/dL (ref 13.0–17.0)
MCH: 28 pg (ref 26.0–34.0)
MCHC: 32.8 g/dL (ref 30.0–36.0)
MCV: 85.4 fL (ref 80.0–100.0)
Platelets: 308 10*3/uL (ref 150–400)
RBC: 5.07 MIL/uL (ref 4.22–5.81)
RDW: 13.7 % (ref 11.5–15.5)
WBC: 14 10*3/uL — ABNORMAL HIGH (ref 4.0–10.5)
nRBC: 0 % (ref 0.0–0.2)

## 2025-01-27 LAB — BASIC METABOLIC PANEL WITH GFR
Anion gap: 13 (ref 5–15)
BUN: 11 mg/dL (ref 8–23)
CO2: 24 mmol/L (ref 22–32)
Calcium: 9 mg/dL (ref 8.9–10.3)
Chloride: 100 mmol/L (ref 98–111)
Creatinine, Ser: 0.73 mg/dL (ref 0.61–1.24)
GFR, Estimated: >60 mL/min
Glucose, Bld: 243 mg/dL — ABNORMAL HIGH (ref 70–99)
Potassium: 4 mmol/L (ref 3.5–5.1)
Sodium: 137 mmol/L (ref 135–145)

## 2025-01-27 MED ORDER — SODIUM CHLORIDE 0.9 % IV SOLN: 500.0000 mg | INTRAVENOUS | Status: DC

## 2025-01-27 MED ORDER — SODIUM CHLORIDE 0.9 % IV SOLN
100.0000 mg | Freq: Once | INTRAVENOUS | Status: DC
  Filled 2025-01-27: qty 100

## 2025-01-27 MED ORDER — MAGNESIUM HYDROXIDE 400 MG/5ML PO SUSP: 30.0000 mL | Freq: Every day | ORAL | Status: AC | PRN

## 2025-01-27 MED ORDER — SODIUM CHLORIDE 0.9 % IV SOLN
2.0000 g | INTRAVENOUS | Status: AC
  Administered 2025-01-28 – 2025-01-29 (×2): 2 g via INTRAVENOUS
  Filled 2025-01-27 (×2): qty 20

## 2025-01-27 MED ORDER — HYDROCOD POLI-CHLORPHE POLI ER 10-8 MG/5ML PO SUER
5.0000 mL | Freq: Two times a day (BID) | ORAL | Status: AC | PRN
  Administered 2025-01-29: 5 mL via ORAL
  Filled 2025-01-27: qty 5

## 2025-01-27 MED ORDER — ENOXAPARIN SODIUM 40 MG/0.4ML IJ SOSY
40.0000 mg | PREFILLED_SYRINGE | INTRAMUSCULAR | Status: AC
  Administered 2025-01-28 – 2025-01-29 (×2): 40 mg via SUBCUTANEOUS
  Filled 2025-01-27 (×2): qty 0.4

## 2025-01-27 MED ORDER — SODIUM CHLORIDE 0.9 % IV SOLN
1.0000 g | Freq: Once | INTRAVENOUS | Status: AC
  Administered 2025-01-27: 1 g via INTRAVENOUS
  Filled 2025-01-27: qty 10

## 2025-01-27 MED ORDER — VITAMIN B-12 1000 MCG PO TABS
1000.0000 ug | ORAL_TABLET | Freq: Every day | ORAL | Status: AC
  Administered 2025-01-28 – 2025-01-29 (×2): 1000 ug via ORAL
  Filled 2025-01-27: qty 2
  Filled 2025-01-27: qty 1

## 2025-01-27 MED ORDER — IPRATROPIUM-ALBUTEROL 0.5-2.5 (3) MG/3ML IN SOLN
3.0000 mL | Freq: Once | RESPIRATORY_TRACT | Status: AC
  Administered 2025-01-27: 3 mL via RESPIRATORY_TRACT
  Filled 2025-01-27: qty 3

## 2025-01-27 MED ORDER — VITAMIN C 500 MG PO TABS
1000.0000 mg | ORAL_TABLET | Freq: Every day | ORAL | Status: AC
  Administered 2025-01-28 – 2025-01-29 (×2): 1000 mg via ORAL
  Filled 2025-01-27 (×2): qty 2

## 2025-01-27 MED ORDER — ONDANSETRON HCL 4 MG/2ML IJ SOLN: 4.0000 mg | Freq: Four times a day (QID) | INTRAMUSCULAR | Status: AC | PRN

## 2025-01-27 MED ORDER — AZITHROMYCIN 500 MG PO TABS
500.0000 mg | ORAL_TABLET | Freq: Every day | ORAL | Status: AC
  Administered 2025-01-27 – 2025-01-29 (×3): 500 mg via ORAL
  Filled 2025-01-27: qty 2
  Filled 2025-01-27 (×2): qty 1

## 2025-01-27 MED ORDER — ONDANSETRON HCL 4 MG PO TABS: 4.0000 mg | ORAL_TABLET | Freq: Four times a day (QID) | ORAL | Status: AC | PRN

## 2025-01-27 MED ORDER — IPRATROPIUM-ALBUTEROL 0.5-2.5 (3) MG/3ML IN SOLN
3.0000 mL | RESPIRATORY_TRACT | Status: AC | PRN
  Filled 2025-01-27: qty 3

## 2025-01-27 MED ORDER — ACETAMINOPHEN 325 MG PO TABS: 650.0000 mg | ORAL_TABLET | Freq: Four times a day (QID) | ORAL | Status: AC | PRN

## 2025-01-27 MED ORDER — VITAMIN D 25 MCG (1000 UNIT) PO TABS
1000.0000 [IU] | ORAL_TABLET | Freq: Every day | ORAL | Status: AC
  Administered 2025-01-28 – 2025-01-29 (×2): 1000 [IU] via ORAL
  Filled 2025-01-27 (×2): qty 1

## 2025-01-27 MED ORDER — TRAZODONE HCL 50 MG PO TABS
25.0000 mg | ORAL_TABLET | Freq: Every evening | ORAL | Status: AC | PRN
  Administered 2025-01-28 – 2025-01-29 (×2): 25 mg via ORAL
  Filled 2025-01-27 (×2): qty 1

## 2025-01-27 MED ORDER — LOSARTAN POTASSIUM 50 MG PO TABS: 25.0000 mg | ORAL_TABLET | Freq: Every day | ORAL | Status: DC

## 2025-01-27 MED ORDER — GUAIFENESIN ER 600 MG PO TB12
600.0000 mg | ORAL_TABLET | Freq: Two times a day (BID) | ORAL | Status: AC
  Administered 2025-01-27 – 2025-01-29 (×5): 600 mg via ORAL
  Filled 2025-01-27 (×5): qty 1

## 2025-01-27 MED ORDER — ZINC GLUCONATE 50 MG PO TABS: 50.0000 mg | ORAL_TABLET | Freq: Every day | ORAL | Status: DC

## 2025-01-27 MED ORDER — ASPIRIN 81 MG PO TBEC
81.0000 mg | DELAYED_RELEASE_TABLET | Freq: Every day | ORAL | Status: AC
  Administered 2025-01-28 – 2025-01-29 (×2): 81 mg via ORAL
  Filled 2025-01-27 (×2): qty 1

## 2025-01-27 MED ORDER — IOHEXOL 350 MG/ML SOLN
100.0000 mL | Freq: Once | INTRAVENOUS | Status: AC | PRN
  Administered 2025-01-27: 100 mL via INTRAVENOUS

## 2025-01-27 MED ORDER — ACETAMINOPHEN 650 MG RE SUPP: 650.0000 mg | Freq: Four times a day (QID) | RECTAL | Status: AC | PRN

## 2025-01-27 MED ORDER — IPRATROPIUM-ALBUTEROL 0.5-2.5 (3) MG/3ML IN SOLN
3.0000 mL | Freq: Four times a day (QID) | RESPIRATORY_TRACT | Status: DC
  Administered 2025-01-28 – 2025-01-29 (×6): 3 mL via RESPIRATORY_TRACT
  Filled 2025-01-27 (×5): qty 3

## 2025-01-27 NOTE — ED Triage Notes (Signed)
 Pt with ongoing difficulty with cough and shob.  Has been ill for about a month. Has taken levaquin  and prednisone , breathing treatments, IM decadron.  Pt was at [facility] today to follow up as he is continuing to be unwell.  Pt sent here for eval.  Sats at home 89 per wife. No formal diagnosis of COPD. Smoked for 35 years.

## 2025-01-27 NOTE — H&P (Incomplete)
 "     [place]   PATIENT NAME: Talen Poser    MR#:  [id number]  DATE OF BIRTH:  05-Mar-1951  DATE OF ADMISSION:  01/27/2025  PRIMARY CARE PHYSICIAN: [facility]   Patient is coming from: Home  REQUESTING/REFERRING PHYSICIAN: Floy Roberts, MD  CHIEF COMPLAINT:   Chief Complaint  Patient presents with   Shortness of Breath    HISTORY OF PRESENT ILLNESS:  BAILEY KOLBE is a 74 y.o. male with medical history significant for essential hypertension, presented to the emergency room with onset of worsening dyspnea over the last month with associated cough productive of creamy sputum as well as wheezing.  He denied any fever or chills.  No nausea or vomiting or abdominal pain.  No chest pain or palpitations.  No dysuria, oliguria or hematuria or flank pain.  He quit smoking cigarettes a month ago.  Used to smoke 1 pack of cigarettes per day for 30 years.  No recent weight loss.  No bleeding diathesis.  ED Course: When the patient came to the ER, BP was 153/71 with heart rate 103 with pulse oximetry of 91% on room air with otherwise normal vital signs.  Labs revealed blood glucose of 243 with otherwise unremarkable BMP.  Lactic acid was 2 and CBC showed leukocytosis of 14.  Blood cultures were drawn. EKG as reviewed by me : EKG showed normal sinus rhythm with a rate of 99 with T wave inversion laterally. Imaging: Chest x-ray showed no acute cardiopulmonary disease. Chest CTA revealed the following: 1. No pulmonary embolus. 2. Findings highly suspicious for intrathoracic malignancy. There are 2 irregular suspicious partially cystic lesions, 1 in the right upper lobe (3.6 x 3.5 x 4.5 cm) and 1 in the left lower lobe (2.9 x 1.7 x 2.2 cm), either of which could represent primary malignancy. Right upper lobe lesion is more suspicious, and contiguous with right hilar masslike opacity. The right hilar process may represent a central lung lesion or pathologic adenopathy. Right  hilar lesion causes significant mass effect and narrowing of the right mainstem bronchus with right upper and lower lobe bronchial filling. Oncologic workup is recommended. 3. Faint tree-in-bud opacities in the inferior right lower lobe, likely infectious or inflammatory. 4. Mildly enlarged subcarinal lymph node. Nonspecific 8 mm left hilar lymph node. There is enlarged lymph node in the porta hepatis in the upper abdomen. 5. 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  The patient was given 2 DuoNebs and 1 g of IV Rocephin .  He will be admitted to a telemetry bed for further evaluation and management. 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   left nasal dorsum, will treat with 5FU/calcipotriene    PAST SURGICAL HISTORY:   Past Surgical History:  Procedure Laterality Date   COLONOSCOPY     ORIF WRIST FRACTURE Left 11/24/2020   Procedure: Left distal radius fracture open reduction internal fixation and surgery as indicated;  Surgeon: Carolee Agent  JINNY MOULD, MD;  Location: [facility];  Service: Orthopedics;  Laterality: Left;    SOCIAL HISTORY:   Social History   Tobacco Use   Smoking status: Every Day    Current packs/day: 0.00    Average packs/day: 0.3 packs/day    Types: Cigarettes    Last attempt to quit: 06/24/2019    Years since quitting: 5.6   Smokeless tobacco: Never   Tobacco comments:    1 1/5 ppd  Substance Use Topics   Alcohol use: No    FAMILY HISTORY:   History reviewed. No pertinent family history.  DRUG ALLERGIES:  Allergies[1]  REVIEW OF SYSTEMS:   ROS As per history of present illness. All pertinent systems were reviewed above. Constitutional, HEENT, cardiovascular, respiratory, GI, GU, musculoskeletal, neuro, psychiatric, endocrine, integumentary and hematologic systems were reviewed and are otherwise negative/unremarkable except for positive findings mentioned above in the HPI.   MEDICATIONS AT HOME:   Prior to Admission medications  Medication Sig Start Date End Date Taking? Authorizing Provider  acetaminophen  (TYLENOL ) 325 MG tablet Take 650 mg by mouth every 6 (six) hours as needed.    [provider]  albuterol  (VENTOLIN  HFA) 108 (90 Base) MCG/ACT inhaler Inhale 2 puffs into the lungs every 6 (six) hours as needed for wheezing or shortness of breath. 12/26/24   Nicholaus Rolland BRAVO, MD  Ascorbic Acid  (VITAMIN C ) 1000 MG tablet Take 1,000 mg by mouth daily.    [provider]  aspirin  EC 81 MG tablet Take 81 mg by mouth daily. Swallow whole.    [provider]  cholecalciferol  (VITAMIN D3) 25 MCG (1000 UNIT) tablet Take 1,000 Units by mouth daily.    [provider]  fluorouracil  (EFUDEX ) 5 % cream Apply topically 2 (two) times daily. To aa at nose, left forearm until red and irritated, about 7 days. 06/23/24   Claudene Lehmann, MD  losartan  (COZAAR ) 25 MG tablet Take 25 mg by mouth daily.    [provider]  mupirocin  ointment (BACTROBAN ) 2 % Apply 1 Application topically daily. 07/22/24   Claudene Lehmann, MD  oxyCODONE -acetaminophen  (PERCOCET) 5-325 MG tablet Take 1-2 tablets by mouth every 6 (six) hours as needed for severe pain. 11/24/20   Carolee Lynwood JINNY III, MD  Quercetin 250 MG TABS Take by mouth.    [provider]  vitamin B-12 (CYANOCOBALAMIN ) 1000 MCG tablet Take 1,000 mcg by mouth daily.    [provider]  zinc  gluconate 50 MG tablet Take 50 mg by mouth  daily.    [provider]      VITAL SIGNS:  Blood pressure (!) 136/122, pulse (!) 101, temperature 98.3 F (36.8 C), temperature source Oral, resp. rate 16, weight 84.8 kg, SpO2 95%.  PHYSICAL EXAMINATION:  Physical Exam  GENERAL:  74 y.o.-year-old patient lying in the bed with no acute distress.  EYES: Pupils equal, round, reactive to light and accommodation. No scleral icterus. Extraocular muscles intact.  HEENT: Head atraumatic, normocephalic. Oropharynx and nasopharynx clear.  NECK:  Supple, no jugular venous distention. No thyroid enlargement, no tenderness.  LUNGS: Diminished bibasilar breath sounds with mild crackles and diffuse expiratory wheezes and rhonchi. No use of accessory muscles of respiration.  CARDIOVASCULAR: Regular rate and rhythm, S1, S2 normal. No murmurs, rubs, or gallops.  ABDOMEN: Soft, nondistended, nontender. Bowel sounds present. No organomegaly or mass.  EXTREMITIES: No pedal edema, cyanosis, or clubbing.  NEUROLOGIC: Cranial nerves II through XII are intact. Muscle strength 5/5 in  all extremities. Sensation intact. Gait not checked.  PSYCHIATRIC: The patient is alert and oriented x 3.  Normal affect and good eye contact. SKIN: No obvious rash, lesion, or ulcer.   LABORATORY PANEL:   CBC Recent Labs  Lab 01/27/25 1743  WBC 14.0*  HGB 14.2  HCT 43.3  PLT 308   ------------------------------------------------------------------------------------------------------------------  Chemistries  Recent Labs  Lab 01/27/25 1743  NA 137  K 4.0  CL 100  CO2 24  GLUCOSE 243*  BUN 11  CREATININE 0.73  CALCIUM 9.0   ------------------------------------------------------------------------------------------------------------------  Cardiac Enzymes No results for input(s): TROPONINI in the last 168 hours. ------------------------------------------------------------------------------------------------------------------  RADIOLOGY:  CT Angio  Chest PE W and/or Wo Contrast Result Date: 01/27/2025 CLINICAL DATA:  SOB Cough and shortness of breath.  Smoking history. EXAM: CT ANGIOGRAPHY CHEST WITH CONTRAST TECHNIQUE: Multidetector CT imaging of the chest was performed using the standard protocol during bolus administration of intravenous contrast. Multiplanar CT image reconstructions and MIPs were obtained to evaluate the vascular anatomy. RADIATION DOSE REDUCTION: This exam was performed according to the departmental dose-optimization program which includes automated exposure control, adjustment of the mA and/or kV according to patient size and/or use of iterative reconstruction technique. CONTRAST:  OMNIPAQUE  IOHEXOL  350 MG/ML SOLN COMPARISON:  Radiograph earlier today.  Chest CT 12/28/2020 FINDINGS: Cardiovascular: There are no filling defects within the pulmonary arteries to suggest pulmonary embolus. Atherosclerosis of the thoracic aorta. No dissection, aneurysm, or acute aortic findings. The heart is normal in size. Faint coronary artery calcifications. No pericardial effusion. Mediastinum/Nodes: Right hilar masslike opacity may represent central lung lesion or nodal conglomerate, measuring 4.6 x 4.1 x 4.3 cm, series 4, image 60. This surrounds the right mainstem bronchus causing bronchial narrowing. Nodular component extends into the suprahilar right lung. Circumferential soft tissue thickening extends to the right lower lobe bronchi. 12 mm subcarinal node series 4, image 63. Left hilar nodes measure up to 8 mm series 4, image 62. 11 mm right thyroid nodule. Not clinically significant; no follow-up imaging recommended (ref: J Am Coll Radiol. 2015 Feb;12(2): 143-50).The esophagus is decompressed. Lungs/Pleura: Right upper lobe thick-walled cystic lesion is contiguous with the right hilar soft tissue mass inferiorly. This lesion measures 3.6 x 3.5 x 4.5 cm, series 5, image 47. There are multiple irregular nodular septations and mild peripheral  irregularity. Right hilar masslike opacity in causes mass effect and luminal narrowing of the right mainstem bronchus. There is bronchial filling of segmental right upper lobe bronchi. Severe narrowing of the right bronchus intermedius. Near complete filling of the right lower lobe bronchus with opacified segmental and subsegmental right lower lobe bronchi. Faint tree-in-bud opacities in the inferior right lower lobe. Mixed density cystic/necrotic lesion with adjacent ground-glass opacity in the left lower lobe measures 2.9 x 1.7 x 2.2 cm, series 5, image 64 and abuts the inter lobar fissure Right greater than left apical pleuroparenchymal scarring. No significant pleural effusion. Upper Abdomen: Low-density left adrenal nodule measures 19 mm and is stable from prior exam consistent with benign adenoma. There is no right adrenal nodule. 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 14 mm porta hepatis node, series 4, image 144. Probable parapelvic cysts in the left kidney. Musculoskeletal: No evidence of focal bone lesion. No subcutaneous soft tissue abnormality. Review of the MIP images confirms the above findings. IMPRESSION: 1. No pulmonary embolus. 2. Findings highly suspicious for intrathoracic malignancy. There are 2 irregular suspicious partially cystic lesions, 1  in the right upper lobe (3.6 x 3.5 x 4.5 cm) and 1 in the left lower lobe (2.9 x 1.7 x 2.2 cm), either of which could represent primary malignancy. Right upper lobe lesion is more suspicious, and contiguous with right hilar masslike opacity. The right hilar process may represent a central lung lesion or pathologic adenopathy. Right hilar lesion causes significant mass effect and narrowing of the right mainstem bronchus with right upper and lower lobe bronchial filling. Oncologic workup is recommended. 3. Faint tree-in-bud opacities in the inferior right lower lobe, likely infectious or inflammatory. 4. Mildly  enlarged subcarinal lymph node. Nonspecific 8 mm left hilar lymph node. There is enlarged lymph node in the porta hepatis in the upper abdomen. 5. 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 Aortic Atherosclerosis (ICD10-I70.0). Electronically Signed   By: Andrea Gasman M.D.   On: 01/27/2025 19:51   DG Chest 2 View Result Date: 01/27/2025 EXAM: 2 VIEW(S) XRAY OF THE CHEST 01/27/2025 06:05:00 PM COMPARISON: 12/25/2024 CLINICAL HISTORY: FINDINGS: LUNGS AND PLEURA: No focal pulmonary opacity. No pleural effusion. No pneumothorax. HEART AND MEDIASTINUM: No acute abnormality of the cardiac and mediastinal silhouettes. BONES AND SOFT TISSUES: Multilevel degenerative changes of thoracic spine. IMPRESSION: 1. No acute cardiopulmonary abnormality. Electronically signed by: Rogelia Myers MD 01/27/2025 06:12 PM EST RP Workstation: [id number]      IMPRESSION AND PLAN:  Assessment and Plan: * Sepsis due to pneumonia (HCC) - Sepsis is manifested by leukocytosis, tachycardia and tachypnea. - The patient will be admitted to a medical telemetry bed. - Will continue antibiotic therapy with IV Rocephin  and p.o. Zithromax . - Mucolytic therapy be provided as well as duo nebs q.i.d. and q.4 hours p.r.n. - We will follow blood cultures. - The patient will be hydrated with IV lactated ringer.   Lung mass - This is suspicious for malignancy with mass effect. - Pulmonary consult as well as oncology consult will be obtained. - I notified Dr. Babara and Dr. Aleskerov about the patient.  Essential hypertension - Continue antihypertensive therapy.       DVT prophylaxis: Lovenox .  Advanced Care Planning:  Code Status: full code.  Family Communication:  The plan of care was discussed in details with the patient (and family). I answered all questions. The patient agreed to proceed with the above mentioned plan. Further management will depend upon hospital course. Disposition Plan: Back to  previous home environment Consults called: none.                   All the records are reviewed and case discussed with ED provider.  Status is: Inpatient    At the time of the admission, it appears that the appropriate admission status for this patient is inpatient.  This is judged to be reasonable and necessary in order to provide the required intensity of service to ensure the patient's safety given the presenting symptoms, physical exam findings and initial radiographic and laboratory data in the context of comorbid conditions.  The patient requires inpatient status due to high intensity of service, high risk of further deterioration and high frequency of surveillance required.  I certify that at the time of admission, it is my clinical judgment that the patient will require inpatient hospital care extending more than 2 midnights.                            Dispo: The patient is from: Home  Anticipated d/c is to: Home              Patient currently is not medically stable to d/c.              Difficult to place patient: No  [place] DELENA Peaches M.D on 01/28/2025 at 3:46 AM  [facility]   From 7 PM-7 AM, contact night-coverage [email or web address]  CC: Primary care physician; [facility]     [1] No Known Allergies  "

## 2025-01-27 NOTE — ED Provider Notes (Signed)
 "  [facility] Provider Note    Event Date/Time   First MD Initiated Contact with Patient 01/27/25 1810     (approximate)   History   Shortness of Breath   HPI  Gary Whitehead is a 74 y.o. male who presents to the emergency department today because of concerns for continued shortness of breath.  Patient for started having the symptoms about a month ago.  Was seen in the emergency department.  Was given albuterol  inhaler.  Since then he has followed up at urgent care where he has been put on a course of steroids as well as antibiotics.  Finished his last dose of antibiotics earlier today.  Has been off steroids for about a week.  Went to urgent care again today however they referred him to the ED for further workup and evaluation.     Physical Exam   Triage Vital Signs: ED Triage Vitals  Encounter Vitals Group     BP 01/27/25 1740 (!) 153/75     Girls Systolic BP Percentile --      Girls Diastolic BP Percentile --      Boys Systolic BP Percentile --      Boys Diastolic BP Percentile --      Pulse Rate 01/27/25 1740 (!) 103     Resp 01/27/25 1740 18     Temp 01/27/25 1740 98.8 F (37.1 C)     Temp src --      SpO2 01/27/25 1740 91 %     Weight --      Height --      Head Circumference --      Peak Flow --      Pain Score 01/27/25 1738 0     Pain Loc --      Pain Education --      Exclude from Growth Chart --     Most recent vital signs: Vitals:   01/27/25 1740  BP: (!) 153/75  Pulse: (!) 103  Resp: 18  Temp: 98.8 F (37.1 C)  SpO2: 91%   General: Awake, alert, oriented. CV:  Good peripheral perfusion. Regular rate and rhythm. Resp:  Slightly increased work of breathing. Expiratory wheezing noted in lower lungs. Abd:  No distention.  Other:  No lower extremity edema.    ED Results / Procedures / Treatments   Labs (all labs ordered are listed, but only abnormal results are displayed) Labs Reviewed  BASIC METABOLIC PANEL WITH GFR  - Abnormal; Notable for the following components:      Result Value   Glucose, Bld 243 (*)    All other components within normal limits  CBC - Abnormal; Notable for the following components:   WBC 14.0 (*)    All other components within normal limits  LACTIC ACID, PLASMA - Abnormal; Notable for the following components:   Lactic Acid, Venous 2.0 (*)    All other components within normal limits  CULTURE, BLOOD (ROUTINE X 2)  CULTURE, BLOOD (ROUTINE X 2)  PROTIME-INR  CORTISOL-AM, BLOOD  BASIC METABOLIC PANEL WITH GFR  CBC     EKG  I, Guadalupe Eagles, attending physician, personally viewed and interpreted this EKG  EKG Time: 1740 Rate: 99 Rhythm: normal sinus rhythm Axis: normal Intervals: qtc 438 QRS: narrow ST changes: no st elevation Impression: normal ekg    RADIOLOGY I independently interpreted and visualized the CXR. My interpretation: No pneumonia Radiology interpretation:  IMPRESSION:  1. No acute cardiopulmonary abnormality.  I independently interpreted and visualized the CTAPE. My interpretation: No PE. Cystic lesions in lungs Radiology interpretation:  IMPRESSION:  1. No pulmonary embolus.  2. Findings highly suspicious for intrathoracic malignancy. There  are 2 irregular suspicious partially cystic lesions, 1 in the right  upper lobe (3.6 x 3.5 x 4.5 cm) and 1 in the left lower lobe (2.9 x  1.7 x 2.2 cm), either of which could represent primary malignancy.  Right upper lobe lesion is more suspicious, and contiguous with  right hilar masslike opacity. The right hilar process may represent  a central lung lesion or pathologic adenopathy. Right hilar lesion  causes significant mass effect and narrowing of the right mainstem  bronchus with right upper and lower lobe bronchial filling.  Oncologic workup is recommended.  3. Faint tree-in-bud opacities in the inferior right lower lobe,  likely infectious or inflammatory.  4. Mildly enlarged subcarinal  lymph node. Nonspecific 8 mm left  hilar lymph node. There is enlarged lymph node in the porta hepatis  in the upper abdomen.  5. Calcified right renal artery aneurysm at the hilum measures 16  mm.    Aortic Atherosclerosis (ICD10-I70.0).     PROCEDURES:  Critical Care performed: Yes  CRITICAL CARE Performed by: Guadalupe Eagles   Total critical care time: 30 minutes  Critical care time was exclusive of separately billable procedures and treating other patients.  Critical care was necessary to treat or prevent imminent or life-threatening deterioration.  Critical care was time spent personally by me on the following activities: development of treatment plan with patient and/or surrogate as well as nursing, discussions with consultants, evaluation of patient's response to treatment, examination of patient, obtaining history from patient or surrogate, ordering and performing treatments and interventions, ordering and review of laboratory studies, ordering and review of radiographic studies, pulse oximetry and re-evaluation of patient's condition.   Procedures    MEDICATIONS ORDERED IN ED: Medications - No data to display   IMPRESSION / MDM / ASSESSMENT AND PLAN / ED COURSE  I reviewed the triage vital signs and the nursing notes.                              Differential diagnosis includes, but is not limited to, pneumonia, PE, COPD  Patient's presentation is most consistent with acute presentation with potential threat to life or bodily function.   The patient is on the cardiac monitor to evaluate for evidence of arrhythmia and/or significant heart rate changes.  Patient presented to the emergency department today with concerns for continued shortness of breath over the past month.  Patient has been on both antibiotics and steroids as an outpatient.  Chest x-ray here without concerning abnormality.  Did then proceed to get a CT scan.  This was concerning for possible  infection as well as malignancy.  I discussed both of these findings with the patient.  Will start IV antibiotics.  Will discuss with hospitalist service for admission.      FINAL CLINICAL IMPRESSION(S) / ED DIAGNOSES   Final diagnoses:  SOB (shortness of breath)  Pneumonia due to infectious organism, unspecified laterality, unspecified part of lung  Lung mass     Note:  This document was prepared using Dragon voice recognition software and may include unintentional dictation errors.    Eagles Guadalupe, MD 01/27/25 2300  "

## 2025-01-28 DIAGNOSIS — I10 Essential (primary) hypertension: Secondary | ICD-10-CM | POA: Insufficient documentation

## 2025-01-28 DIAGNOSIS — J189 Pneumonia, unspecified organism: Secondary | ICD-10-CM | POA: Diagnosis not present

## 2025-01-28 DIAGNOSIS — R918 Other nonspecific abnormal finding of lung field: Secondary | ICD-10-CM

## 2025-01-28 DIAGNOSIS — A419 Sepsis, unspecified organism: Secondary | ICD-10-CM | POA: Diagnosis not present

## 2025-01-28 LAB — CBC
HCT: 43.3 % (ref 39.0–52.0)
Hemoglobin: 14.1 g/dL (ref 13.0–17.0)
MCH: 27.9 pg (ref 26.0–34.0)
MCHC: 32.6 g/dL (ref 30.0–36.0)
MCV: 85.7 fL (ref 80.0–100.0)
Platelets: 311 10*3/uL (ref 150–400)
RBC: 5.05 MIL/uL (ref 4.22–5.81)
RDW: 14 % (ref 11.5–15.5)
WBC: 11.1 10*3/uL — ABNORMAL HIGH (ref 4.0–10.5)
nRBC: 0 % (ref 0.0–0.2)

## 2025-01-28 LAB — BASIC METABOLIC PANEL WITH GFR
Anion gap: 14 (ref 5–15)
BUN: 11 mg/dL (ref 8–23)
CO2: 24 mmol/L (ref 22–32)
Calcium: 9.4 mg/dL (ref 8.9–10.3)
Chloride: 100 mmol/L (ref 98–111)
Creatinine, Ser: 0.74 mg/dL (ref 0.61–1.24)
GFR, Estimated: >60 mL/min
Glucose, Bld: 234 mg/dL — ABNORMAL HIGH (ref 70–99)
Potassium: 4.5 mmol/L (ref 3.5–5.1)
Sodium: 138 mmol/L (ref 135–145)

## 2025-01-28 LAB — PROTIME-INR
INR: 1 (ref 0.8–1.2)
Prothrombin Time: 13.3 s (ref 11.4–15.2)

## 2025-01-28 LAB — CORTISOL-AM, BLOOD: Cortisol - AM: 1.5 ug/dL — ABNORMAL LOW (ref 6.7–22.6)

## 2025-01-28 NOTE — ED Notes (Signed)
Fall bundle in place

## 2025-01-28 NOTE — Assessment & Plan Note (Addendum)
-   This is suspicious for malignancy with mass effect. - Pulmonary consult as well as oncology consult will be obtained. - I notified Dr. Babara and Dr. Aleskerov about the patient.

## 2025-01-28 NOTE — Assessment & Plan Note (Signed)
 -  Continue antihypertensive therapy ?

## 2025-01-28 NOTE — Assessment & Plan Note (Signed)
-   Sepsis is manifested by leukocytosis, tachycardia and tachypnea. - The patient will be admitted to a medical telemetry bed. - Will continue antibiotic therapy with IV Rocephin  and p.o. Zithromax . - Mucolytic therapy be provided as well as duo nebs q.i.d. and q.4 hours p.r.n. - We will follow blood cultures. - The patient will be hydrated with IV lactated ringer.

## 2025-01-28 NOTE — Consult Note (Signed)
 "  Hematology/Oncology Consult note [phone] [phone]      Patient Care Team: [facility] as PCP - General   Name of the patient: Gary Whitehead  [id number]  09-Nov-1951   REASON FOR COSULTATION:  Lung mass History of presenting illness-  74 y.o. male with PMH listed at below who presents to ER for evaluation of worsening of shortness of breath over the past month associated with productive cough and wheezing.  Patient previously smoked a pack of cigarettes per day for the past 30 years.  He stopped smoking about a month ago.  He denies weight loss or night sweats. He has noticed occasional hemoptysis.  No fever or chills.  Emergency room workup Chest x-ray showed no acute cardiopulmonary disease. CT chest angiogram showed  1. No pulmonary embolus. 2. Findings highly suspicious for intrathoracic malignancy. There are 2 irregular suspicious partially cystic lesions, 1 in the right upper lobe (3.6 x 3.5 x 4.5 cm) and 1 in the left lower lobe (2.9 x 1.7 x 2.2 cm), either of which could represent primary malignancy. Right upper lobe lesion is more suspicious, and contiguous with right hilar masslike opacity. The right hilar process may represent a central lung lesion or pathologic adenopathy. Right hilar lesion causes significant mass effect and narrowing of the right mainstem bronchus with right upper and lower lobe bronchial filling. Oncologic workup is recommended. 3. Faint tree-in-bud opacities in the inferior right lower lobe, likely infectious or inflammatory. 4. Mildly enlarged subcarinal lymph node. Nonspecific 8 mm left hilar lymph node. There is enlarged lymph node in the porta hepatis in the upper abdomen. 5. 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  Patient was admitted for treatment of pneumonia.  Oncology was consulted for evaluation of lung mass. Patient was seen at bedside.  Son Clotilda at the  bedside. Patient has received IV antibiotics, breathing treatments.  Breathing status has mildly improved.  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   left shoulder, excision 07/22/24, margins free   Closed fracture of left distal radius    Dysplastic nevus 12/29/2024   Right flank- excision scheduled   Hypertension    Patient denies medical problems    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   left forearm, treating with 5FU/calcipotriene   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   left nasal dorsum, will treat with 5FU/calcipotriene     Past Surgical History:  Procedure Laterality Date   COLONOSCOPY     ORIF WRIST FRACTURE Left 11/24/2020   Procedure: Left distal radius fracture open reduction internal fixation and surgery as indicated;  Surgeon: Carolee Lynwood JINNY DOUGLAS, MD;  Location: [facility];  Service: Orthopedics;  Laterality: Left;    Social History   Socioeconomic History   Marital status: Married    Spouse name: Not on file   Number of children: Not on file   Years of education:  Not on file   Highest education level: Not on file  Occupational History   Not on file  Tobacco Use   Smoking status: Every Day    Current packs/day: 0.00    Average packs/day: 0.3 packs/day    Types: Cigarettes    Last attempt to quit: 06/24/2019    Years since quitting: 5.6   Smokeless tobacco: Never   Tobacco comments:    1 1/5 ppd  Vaping Use    Vaping status: Never Used  Substance and Sexual Activity   Alcohol use: No   Drug use: No   Sexual activity: Yes    Comment: MArried  Other Topics Concern   Not on file  Social History Narrative   Not on file   Social Drivers of Health   Tobacco Use: High Risk (01/27/2025)   Patient History    Smoking Tobacco Use: Every Day    Smokeless Tobacco Use: Never    Passive Exposure: Not on file  Financial Resource Strain: Not on file  Food Insecurity: No Food Insecurity (01/28/2025)   Epic    Worried About Programme Researcher, Broadcasting/film/video in the Last Year: Never true    Ran Out of Food in the Last Year: Never true  Transportation Needs: No Transportation Needs (01/28/2025)   Epic    Lack of Transportation (Medical): No    Lack of Transportation (Non-Medical): No  Physical Activity: Not on file  Stress: Not on file  Social Connections: Socially Integrated (01/28/2025)   Social Connection and Isolation Panel    Frequency of Communication with Friends and Family: More than three times a week    Frequency of Social Gatherings with Friends and Family: More than three times a week    Attends Religious Services: 1 to 4 times per year    Active Member of [employer] or Organizations: Yes    Attends Banker Meetings: 1 to 4 times per year    Marital Status: Married  Catering Manager Violence: Not At Risk (01/28/2025)   Epic    Fear of Current or Ex-Partner: No    Emotionally Abused: No    Physically Abused: No    Sexually Abused: No  Depression (PHQ2-9): Not on file  Alcohol Screen: Not on file  Housing: Low Risk (01/28/2025)   Epic    Unable to Pay for Housing in the Last Year: No    Number of Times Moved in the Last Year: 0    Homeless in the Last Year: No  Utilities: Not At Risk (01/28/2025)   Epic    Threatened with loss of utilities: No  Health Literacy: Not on file     History reviewed. No pertinent family history.  Current Medications[2]  Review of Systems  Constitutional:   Negative for appetite change, chills, fatigue and fever.  HENT:   Negative for hearing loss and voice change.   Eyes:  Negative for eye problems.  Respiratory:  Positive for cough, shortness of breath and wheezing. Negative for chest tightness.   Cardiovascular:  Negative for chest pain.  Gastrointestinal:  Negative for abdominal distention, abdominal pain and blood in stool.  Endocrine: Negative for hot flashes.  Genitourinary:  Negative for difficulty urinating and frequency.   Musculoskeletal:  Negative for arthralgias.  Skin:  Negative for itching and rash.  Neurological:  Negative for extremity weakness.  Hematological:  Negative for adenopathy.  Psychiatric/Behavioral:  Negative for confusion.     PHYSICAL EXAM Vitals:   01/28/25 1400 01/28/25  1833 01/28/25 2017 01/28/25 2052  BP: (!) 146/70 129/73 (!) 152/78   Pulse: (!) 101 98 95   Resp: 15 16 18    Temp: 98 F (36.7 C) 97.9 F (36.6 C) 98 F (36.7 C)   TempSrc: Oral Oral Oral   SpO2: 95% 95% 95% 95%  Weight:   178 lb 6.4 oz (80.9 kg)   Height:   5' 11 (1.803 m)    Physical Exam    LABORATORY STUDIES    Latest Ref Rng & Units 01/28/2025    4:58 AM 01/27/2025    5:43 PM 12/25/2024   10:08 PM  CBC  WBC 4.0 - 10.5 K/uL 11.1  14.0  15.3   Hemoglobin 13.0 - 17.0 g/dL 85.8  85.7  85.8   Hematocrit 39.0 - 52.0 % 43.3  43.3  43.1   Platelets 150 - 400 K/uL 311  308  327       Latest Ref Rng & Units 01/28/2025    4:58 AM 01/27/2025    5:43 PM 12/25/2024   10:08 PM  CMP  Glucose 70 - 99 mg/dL 765  756  759   BUN 8 - 23 mg/dL 11  11  20    Creatinine 0.61 - 1.24 mg/dL 9.25  9.26  9.12   Sodium 135 - 145 mmol/L 138  137  136   Potassium 3.5 - 5.1 mmol/L 4.5  4.0  4.1   Chloride 98 - 111 mmol/L 100  100  99   CO2 22 - 32 mmol/L 24  24  26    Calcium 8.9 - 10.3 mg/dL 9.4  9.0  9.5      RADIOGRAPHIC STUDIES: I have personally reviewed the radiological images as listed and agreed with the findings in the report. CT Angio  Chest PE W and/or Wo Contrast Result Date: 01/27/2025 CLINICAL DATA:  SOB Cough and shortness of breath.  Smoking history. EXAM: CT ANGIOGRAPHY CHEST WITH CONTRAST TECHNIQUE: Multidetector CT imaging of the chest was performed using the standard protocol during bolus administration of intravenous contrast. Multiplanar CT image reconstructions and MIPs were obtained to evaluate the vascular anatomy. RADIATION DOSE REDUCTION: This exam was performed according to the departmental dose-optimization program which includes automated exposure control, adjustment of the mA and/or kV according to patient size and/or use of iterative reconstruction technique. CONTRAST:  OMNIPAQUE  IOHEXOL  350 MG/ML SOLN COMPARISON:  Radiograph earlier today.  Chest CT 12/28/2020 FINDINGS: Cardiovascular: There are no filling defects within the pulmonary arteries to suggest pulmonary embolus. Atherosclerosis of the thoracic aorta. No dissection, aneurysm, or acute aortic findings. The heart is normal in size. Faint coronary artery calcifications. No pericardial effusion. Mediastinum/Nodes: Right hilar masslike opacity may represent central lung lesion or nodal conglomerate, measuring 4.6 x 4.1 x 4.3 cm, series 4, image 60. This surrounds the right mainstem bronchus causing bronchial narrowing. Nodular component extends into the suprahilar right lung. Circumferential soft tissue thickening extends to the right lower lobe bronchi. 12 mm subcarinal node series 4, image 63. Left hilar nodes measure up to 8 mm series 4, image 62. 11 mm right thyroid nodule. Not clinically significant; no follow-up imaging recommended (ref: J Am Coll Radiol. 2015 Feb;12(2): 143-50).The esophagus is decompressed. Lungs/Pleura: Right upper lobe thick-walled cystic lesion is contiguous with the right hilar soft tissue mass inferiorly. This lesion measures 3.6 x 3.5 x 4.5 cm, series 5, image 47. There are multiple irregular nodular septations and mild peripheral  irregularity. Right hilar masslike opacity in causes  mass effect and luminal narrowing of the right mainstem bronchus. There is bronchial filling of segmental right upper lobe bronchi. Severe narrowing of the right bronchus intermedius. Near complete filling of the right lower lobe bronchus with opacified segmental and subsegmental right lower lobe bronchi. Faint tree-in-bud opacities in the inferior right lower lobe. Mixed density cystic/necrotic lesion with adjacent ground-glass opacity in the left lower lobe measures 2.9 x 1.7 x 2.2 cm, series 5, image 64 and abuts the inter lobar fissure Right greater than left apical pleuroparenchymal scarring. No significant pleural effusion. Upper Abdomen: Low-density left adrenal nodule measures 19 mm and is stable from prior exam consistent with benign adenoma. There is no right adrenal nodule. 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 14 mm porta hepatis node, series 4, image 144. Probable parapelvic cysts in the left kidney. Musculoskeletal: No evidence of focal bone lesion. No subcutaneous soft tissue abnormality. Review of the MIP images confirms the above findings. IMPRESSION: 1. No pulmonary embolus. 2. Findings highly suspicious for intrathoracic malignancy. There are 2 irregular suspicious partially cystic lesions, 1 in the right upper lobe (3.6 x 3.5 x 4.5 cm) and 1 in the left lower lobe (2.9 x 1.7 x 2.2 cm), either of which could represent primary malignancy. Right upper lobe lesion is more suspicious, and contiguous with right hilar masslike opacity. The right hilar process may represent a central lung lesion or pathologic adenopathy. Right hilar lesion causes significant mass effect and narrowing of the right mainstem bronchus with right upper and lower lobe bronchial filling. Oncologic workup is recommended. 3. Faint tree-in-bud opacities in the inferior right lower lobe, likely infectious or inflammatory. 4. Mildly  enlarged subcarinal lymph node. Nonspecific 8 mm left hilar lymph node. There is enlarged lymph node in the porta hepatis in the upper abdomen. 5. 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 Aortic Atherosclerosis (ICD10-I70.0). Electronically Signed   By: Andrea Gasman M.D.   On: 01/27/2025 19:51   DG Chest 2 View Result Date: 01/27/2025 EXAM: 2 VIEW(S) XRAY OF THE CHEST 01/27/2025 06:05:00 PM COMPARISON: 12/25/2024 CLINICAL HISTORY: FINDINGS: LUNGS AND PLEURA: No focal pulmonary opacity. No pleural effusion. No pneumothorax. HEART AND MEDIASTINUM: No acute abnormality of the cardiac and mediastinal silhouettes. BONES AND SOFT TISSUES: Multilevel degenerative changes of thoracic spine. IMPRESSION: 1. No acute cardiopulmonary abnormality. Electronically signed by: Rogelia Myers MD 01/27/2025 06:12 PM EST RP Workstation: [id number]   DG Chest 2 View Result Date: 12/25/2024 EXAM: 2 VIEW(S) XRAY OF THE CHEST 12/25/2024 10:19:46 PM COMPARISON: 08/17/2019 CLINICAL HISTORY: SOB FINDINGS: LUNGS AND PLEURA: No focal pulmonary opacity. No pleural effusion. No pneumothorax. HEART AND MEDIASTINUM: No acute abnormality of the cardiac and mediastinal silhouettes. BONES AND SOFT TISSUES: No acute osseous abnormality. IMPRESSION: 1. No active cardiopulmonary disease. Electronically signed by: Franky Crease MD 12/25/2024 11:14 PM EST RP Workstation: [id number]     Assessment and plan-   # Community-acquired pneumonia Continue IV antibiotics ceftriaxone  with p.o. Z-Pak. Breathing treatments with DuoNebs.  # Bilateral partially cystic lung mass, with contiguous right hilar mass causing mass effect and right mainstem bronchus narrowing Suspicious for primary lung cancer. Recommend tissue diagnosis.  Appreciate Pulmonology for input of feasibility of biopsy via bronchoscopy. Patient he will need PET scan as well as MRI brain to complete staging. He will follow-up outpatient with cancer  center.    Thank you for allowing me to participate in the care of this patient.  Zelphia Cap, MD, PhD Hematology Oncology 01/28/2025     [1] No Known Allergies [2]  Current Facility-Administered Medications:    acetaminophen  (TYLENOL ) tablet 650 mg, 650 mg, Oral, Q6H PRN **OR** acetaminophen  (TYLENOL ) suppository 650 mg, 650 mg, Rectal, Q6H PRN, Mansy, Jan A, MD   ascorbic acid  (VITAMIN C ) tablet 1,000 mg, 1,000 mg, Oral, Daily, Mansy, Jan A, MD, 1,000 mg at 01/28/25 1031   aspirin  EC tablet 81 mg, 81 mg, Oral, Daily, Mansy, Jan A, MD, 81 mg at 01/28/25 1032   azithromycin  (ZITHROMAX ) tablet 500 mg, 500 mg, Oral, Daily, Nazari, Walid A, RPH, 500 mg at 01/28/25 1031   cefTRIAXone  (ROCEPHIN ) 2 g in sodium chloride  0.9 % 100 mL IVPB, 2 g, Intravenous, Q24H, Mansy, Jan A, MD, Last Rate: 200 mL/hr at 01/28/25 2148, 2 g at 01/28/25 2148   chlorpheniramine-HYDROcodone (TUSSIONEX) 10-8 MG/5ML suspension 5 mL, 5 mL, Oral, Q12H PRN, Mansy, Jan A, MD   cholecalciferol  (VITAMIN D3) 25 MCG (1000 UNIT) tablet 1,000 Units, 1,000 Units, Oral, Daily, Mansy, Jan A, MD, 1,000 Units at 01/28/25 1032   cyanocobalamin  (VITAMIN B12) tablet 1,000 mcg, 1,000 mcg, Oral, Daily, Mansy, Jan A, MD, 1,000 mcg at 01/28/25 1032   enoxaparin  (LOVENOX ) injection 40 mg, 40 mg, Subcutaneous, Q24H, Mansy, Jan A, MD, 40 mg at 01/28/25 1032   guaiFENesin  (MUCINEX ) 12 hr tablet 600 mg, 600 mg, Oral, BID, Mansy, Jan A, MD, 600 mg at 01/28/25 2120   ipratropium-albuterol  (DUONEB) 0.5-2.5 (3) MG/3ML nebulizer solution 3 mL, 3 mL, Nebulization, QID, Mansy, Jan A, MD, 3 mL at 01/28/25 2050   ipratropium-albuterol  (DUONEB) 0.5-2.5 (3) MG/3ML nebulizer solution 3 mL, 3 mL, Nebulization, Q4H PRN, Belue, Rankin RAMAN, RPH   magnesium  hydroxide (MILK OF MAGNESIA) suspension 30 mL, 30 mL, Oral, Daily PRN, Mansy, Jan A, MD   ondansetron  (ZOFRAN ) tablet 4 mg, 4 mg, Oral, Q6H PRN **OR** ondansetron  (ZOFRAN ) injection 4 mg, 4 mg, Intravenous, Q6H PRN,  Mansy, Jan A, MD   traZODone  (DESYREL ) tablet 25 mg, 25 mg, Oral, QHS PRN, Mansy, Jan A, MD, 25 mg at 01/28/25 2127  "

## 2025-01-28 NOTE — Progress Notes (Signed)
" °  Progress Note   Patient: Gary Whitehead FMW:[id number] DOB: August 16, 1951 DOA: 01/27/2025     1 DOS: the patient was seen and examined on 01/28/2025   Brief hospital course: Gary Whitehead is a 74 y.o. male with medical history significant for essential hypertension, presented to the emergency room with onset of worsening dyspnea over the last month with associated cough productive of creamy sputum as well as wheezing.  He denied any fever or chills. SABRASABRASABRAUsed to smoke 1 pack of cigarettes per day for 30 years. No recent weight loss. No bleeding diathesis.   ED evaluation concerning for sepsis due to pneumonia, and suspected lung malignancy seen on CTA chest which ruled out of PE, but showed 2 suspicious lesion (RUL and LLL).  Significant events: 01/27/25 - admitted PM. Started on antibiotics. Oncology and Pulmonology consults   Assessment and Plan:  Sepsis due to pneumonia (HCC) Sepsis POA with leukocytosis, tachycardia tachypnea. -- Continue IV Rocephin  and p.o. Zithromax . - Mucinex  -- Duonebs QID and PRN -- IS and flutter --Follow blood cultures. -- Completed IV fluids     Lung mass Imaging findings suspicious for malignancy with mass effect. - Pulmonary and Oncology were consulted on admission.  Dr. Lawence notified Dr. Babara and Dr. Aleskerov about the patient. --Will need biopsy for diagnosis and treatment planning    Essential hypertension - Continue antihypertensive therapy.      Subjective: Pt seen in ED holding for a bed today. He called his wife on his cell phone on speaker.  He reports breathing much improved since he came in.  Still wheezing and coughing but less short of breath.  Denies other complaints.  We discussed that next step would likely be biopsy of lung mass for diagnosis and treatment planning.   Physical Exam: Vitals:   01/28/25 0539 01/28/25 0700 01/28/25 1030 01/28/25 1400  BP:  (!) 141/75 134/70 (!) 146/70  Pulse:  94 (!) 107 (!) 101  Resp:  19 17 15    Temp: 98.3 F (36.8 C) 98.3 F (36.8 C) 98.3 F (36.8 C) 98 F (36.7 C)  TempSrc: Oral Oral Oral Oral  SpO2:  93% 94% 95%  Weight:       General exam: awake, alert, no acute distress HEENT: moist mucus membranes, hearing grossly normal  Respiratory system: diffuse wheezes and coarse breath sounds, normal respiratory effort at rest. Audible wheezing as well. Cardiovascular system: normal S1/S2, RRR, no JVD, murmurs, rubs, gallops, no pedal edema.   Gastrointestinal system: soft, NT, ND, no HSM felt, +bowel sounds. Central nervous system: A&O x 4. no gross focal neurologic deficits, normal speech Extremities: moves all, no edema, normal tone Skin: dry, intact, normal temperature Psychiatry: normal mood, congruent affect, judgement and insight appear normal  Data Reviewed:  Notable labs --   Glucose 234 WBC improved 14 >> 11.1  Family Communication: wife on speaker phone during bedside encounter  Disposition: Status is: Inpatient Remains inpatient appropriate because: remains on IV antibiotics, ongoing evaluation   Planned Discharge Destination: Home    Time spent: 45 minutes  Author: Burnard DELENA Cunning, DO 01/28/2025 6:28 PM  For on call review [email or web address].  "

## 2025-01-29 LAB — STREP PNEUMONIAE URINARY ANTIGEN: Strep Pneumo Urinary Antigen: NEGATIVE

## 2025-01-29 LAB — CBC
HCT: 42.5 % (ref 39.0–52.0)
Hemoglobin: 14 g/dL (ref 13.0–17.0)
MCH: 28.7 pg (ref 26.0–34.0)
MCHC: 32.9 g/dL (ref 30.0–36.0)
MCV: 87.1 fL (ref 80.0–100.0)
Platelets: 315 10*3/uL (ref 150–400)
RBC: 4.88 MIL/uL (ref 4.22–5.81)
RDW: 14.5 % (ref 11.5–15.5)
WBC: 15.1 10*3/uL — ABNORMAL HIGH (ref 4.0–10.5)
nRBC: 0 % (ref 0.0–0.2)

## 2025-01-29 LAB — CULTURE, BLOOD (ROUTINE X 2)
Culture: NO GROWTH
Culture: NO GROWTH

## 2025-01-29 LAB — LACTIC ACID, PLASMA: Lactic Acid, Venous: 1.5 mmol/L (ref 0.5–1.9)

## 2025-01-29 MED ORDER — PREDNISONE 20 MG PO TABS: 25.0000 mg | ORAL_TABLET | Freq: Every day | ORAL | Status: AC

## 2025-01-29 MED ORDER — PREDNISONE 10 MG PO TABS: 15.0000 mg | ORAL_TABLET | Freq: Every day | ORAL | Status: AC

## 2025-01-29 MED ORDER — PREDNISONE 20 MG PO TABS: 50.0000 mg | ORAL_TABLET | Freq: Every day | ORAL | Status: DC

## 2025-01-29 MED ORDER — PREDNISONE 10 MG PO TABS: 10.0000 mg | ORAL_TABLET | Freq: Every day | ORAL | Status: AC

## 2025-01-29 MED ORDER — PREDNISONE 20 MG PO TABS: 35.0000 mg | ORAL_TABLET | Freq: Every day | ORAL | Status: AC

## 2025-01-29 MED ORDER — PREDNISONE 20 MG PO TABS: 45.0000 mg | ORAL_TABLET | Freq: Every day | ORAL | Status: AC

## 2025-01-29 MED ORDER — FUROSEMIDE 10 MG/ML IJ SOLN
20.0000 mg | Freq: Every day | INTRAMUSCULAR | Status: AC
  Administered 2025-01-29: 20 mg via INTRAVENOUS
  Filled 2025-01-29: qty 4

## 2025-01-29 MED ORDER — PREDNISONE 20 MG PO TABS: 30.0000 mg | ORAL_TABLET | Freq: Every day | ORAL | Status: AC

## 2025-01-29 MED ORDER — PREDNISONE 20 MG PO TABS: 50.0000 mg | ORAL_TABLET | Freq: Every day | ORAL | Status: AC

## 2025-01-29 MED ORDER — PREDNISONE 10 MG PO TABS: 5.0000 mg | ORAL_TABLET | Freq: Every day | ORAL | Status: AC

## 2025-01-29 MED ORDER — PREDNISONE 20 MG PO TABS: 20.0000 mg | ORAL_TABLET | Freq: Every day | ORAL | Status: AC

## 2025-01-29 MED ORDER — PREDNISONE 20 MG PO TABS: 40.0000 mg | ORAL_TABLET | Freq: Every day | ORAL | Status: AC

## 2025-01-29 MED ORDER — IPRATROPIUM-ALBUTEROL 0.5-2.5 (3) MG/3ML IN SOLN
3.0000 mL | Freq: Three times a day (TID) | RESPIRATORY_TRACT | Status: AC
  Administered 2025-01-29: 3 mL via RESPIRATORY_TRACT
  Filled 2025-01-29: qty 3

## 2025-01-29 NOTE — Progress Notes (Addendum)
" °  Progress Note   Patient: Gary Whitehead FMW:[id number] DOB: 05-12-1951 DOA: 01/27/2025     2 DOS: the patient was seen and examined on 01/29/2025   Brief hospital course: Gary Whitehead is a 74 y.o. male with medical history significant for essential hypertension, presented to the emergency room with onset of worsening dyspnea over the last month with associated cough productive of creamy sputum as well as wheezing.  He denied any fever or chills. SABRASABRASABRAUsed to smoke 1 pack of cigarettes per day for 30 years. No recent weight loss. No bleeding diathesis.   ED evaluation concerning for sepsis due to pneumonia, and suspected lung malignancy seen on CTA chest which ruled out of PE, but showed 2 suspicious lesion (RUL and LLL).  Significant events: 01/27/25 - admitted PM. Started on antibiotics. Oncology and Pulmonology consults   Assessment and Plan:  Sepsis due to pneumonia (HCC) Sepsis POA with leukocytosis, tachycardia tachypnea. -- Continue IV Rocephin  and p.o. Zithromax . - Mucinex  -- Duonebs QID and PRN -- IS and flutter --Follow blood cultures. -- Completed IV fluids     Lung mass Imaging findings suspicious for malignancy with mass effect. - Pulmonary and Oncology were consulted on admission.  Dr. Lawence notified Dr. Babara and Dr. Aleskerov about the patient. --Will need biopsy for diagnosis and treatment planning  --Seen by Oncology on 2/5 --Awaiting Pulm recommendations & plan   Essential hypertension - Continue antihypertensive therapy.  Hyperglycemia - fasting glucose on AM labs > 200.   --Check HbgA1c level    Subjective: Pt seen in ED holding for a bed today. He called his wife on his cell phone on speaker.  He reports breathing much improved since he came in.  Still wheezing and coughing but less short of breath.  Denies other complaints.  We discussed that next step would likely be biopsy of lung mass for diagnosis and treatment planning.   Physical Exam: Vitals:    01/28/25 2017 01/28/25 2052 01/29/25 0438 01/29/25 0735  BP: (!) 152/78  136/76 139/78  Pulse: 95  100 (!) 109  Resp: 18  16 17   Temp: 98 F (36.7 C)  (!) 97.5 F (36.4 C) 98.7 F (37.1 C)  TempSrc: Oral   Oral  SpO2: 95% 95% 92% 93%  Weight: 80.9 kg     Height: 5' 11 (1.803 m)      General exam: awake, alert, no acute distress HEENT: moist mucus membranes, hearing grossly normal  Respiratory system: lung sounds improved, coarse bases, minimal wheezing, on room air with normal respiratory effort at rest Cardiovascular system: normal S1/S2, RRR, no edema Gastrointestinal system: soft, NT, ND, +bowel sounds. Central nervous system: A&O x 4. no gross focal neurologic deficits, normal speech Extremities: moves all, no edema, normal tone Skin: dry, intact, normal temperature Psychiatry: normal mood, congruent affect, judgement and insight appear normal  Data Reviewed:  Notable labs --   Glucose 234 on last BMP WBC improved 14 >> 11.1 >> 15.1  Family Communication: wife and another family member at bedside on rounds  Disposition: Status is: Inpatient Remains inpatient appropriate because: remains on IV antibiotics, ongoing evaluation   Planned Discharge Destination: Home    Time spent: 45 minutes  Author: Burnard DELENA Cunning, DO 01/29/2025 2:22 PM  For on call review [email or web address]  "

## 2025-01-29 NOTE — Plan of Care (Signed)

## 2025-01-29 NOTE — Consult Note (Signed)
 "    PULMONOLOGY         Date: 01/29/2025,   MRN# [id number] Gary Whitehead May 31, 1951     AdmissionWeight: 84.8 kg                 CurrentWeight: 80.9 kg  Referring provider: Dr Fausto    CHIEF COMPLAINT:   Abnormal CT chest with hilar mass and right mainstem bronchus narrowing   HISTORY OF PRESENT ILLNESS   This is a 74 yo male with a history of actinic keratosis, osteoarthritis of the right ankle, basal cell carcinoma at the left shoulder, fracture of the left distal radius, dysplastic nevus, squamous cell carcinoma of the skin, essential hypertension, who came into the emergency department with complaints of worsening dyspnea and cough.  He also reports expectoration of thick phlegm as well as wheezing.  He is a lifelong smoker.  He reports that he stopped smoking about 1 month ago due to worsening dyspnea prior to this he smoked consistently 1 pack/day for at least 30 years.  He denies hemoptysis denies sick contacts.  He came to the emergency department he was tachyarrhythmic with mild hypoxemia and hyperglycemia.  He had septic workup done including blood cultures and EKG which showed signs of ischemia.  He also had abnormal chest x-ray which was followed up with a CT of the chest.  The CT chest was significant for 2 irregular suspicious partially cystic lesions 1 in the right upper lobe approximately 3.5 cm and 1 in the left lower lobe 1.7 cm either of which could represent malignancy.  There is also a right upper lobe lesion that is more suspicious and contiguous with the right hilar masslike opacity.  The right mainstem bronchus with right upper and lower lobe bronchial stenosis and surrounding tree-in-bud opacification.  There is enlargement of the mediastinal and hilar lymph node stations consistent with metastatic implants.  PCCM consultation has been placed for additional evaluation management of these abnormal findings and CT chest.   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 No pertinent family history.   SOCIAL HISTORY   Social History[1]   MEDICATIONS    Home Medication:    Current Medication: Current Medications[2]    ALLERGIES   Patient has no known allergies.     REVIEW OF SYSTEMS    Review of Systems:  Gen:  Denies  fever, sweats, chills weigh loss  HEENT: Denies blurred vision, double vision, ear pain, eye pain, hearing loss, nose bleeds, sore throat Cardiac:  No dizziness, chest pain or heaviness, chest tightness,edema Resp:   reports dyspnea chronically  Gi: Denies swallowing difficulty, stomach pain, nausea or vomiting, diarrhea, constipation, bowel incontinence Gu:  Denies bladder incontinence, burning urine Ext:   Denies Joint pain, stiffness or swelling Skin: Denies  skin rash, easy bruising or bleeding or hives Endoc:  Denies polyuria, polydipsia , polyphagia or weight change Psych:   Denies depression, insomnia or hallucinations   Other:  All other systems negative   VS: BP 139/78 (BP Location:  Right Arm)   Pulse (!) 109   Temp 98.7 F (37.1 C) (Oral)   Resp 17   Ht 5' 11 (1.803 m)   Wt 80.9 kg   SpO2 93%   BMI 24.88 kg/m      PHYSICAL EXAM    GENERAL:NAD, no fevers, chills, no weakness no fatigue HEAD: Normocephalic, atraumatic.  EYES: Pupils equal, round, reactive to light. Extraocular muscles intact. No scleral icterus.  MOUTH: Moist mucosal membrane. Dentition intact. No abscess noted.  EAR, NOSE, THROAT: Clear without exudates. No external lesions.  NECK: Supple. No thyromegaly. No nodules. No JVD.  PULMONARY: decreased breath sounds with mild rhonchi worse at bases bilaterally.  CARDIOVASCULAR: S1 and S2. Regular rate and rhythm. No murmurs, rubs, or gallops. No edema. Pedal pulses 2+ bilaterally.  GASTROINTESTINAL: Soft, nontender, nondistended. No masses. Positive bowel sounds. No hepatosplenomegaly.  MUSCULOSKELETAL: No swelling, clubbing, or edema. Range of motion full in all extremities.  NEUROLOGIC: Cranial nerves II through XII are intact. No gross focal neurological deficits. Sensation intact. Reflexes intact.  SKIN: No ulceration, lesions, rashes, or cyanosis. Skin warm and dry. Turgor intact.  PSYCHIATRIC: Mood, affect within normal limits. The patient is awake, alert and oriented x 3. Insight, judgment intact.       IMAGING   @IMAGES @ Study Result Narrative & Impression CLINICAL DATA:  SOB   Cough and shortness of breath.  Smoking history.   EXAM: CT ANGIOGRAPHY CHEST WITH CONTRAST   TECHNIQUE: Multidetector CT imaging of the chest was performed using the standard protocol during bolus administration of intravenous contrast. Multiplanar CT image reconstructions and MIPs were obtained to evaluate the vascular anatomy.   RADIATION DOSE REDUCTION: This exam was performed according to the departmental dose-optimization program which includes automated exposure control, adjustment of the mA and/or kV according to patient size and/or use of  iterative reconstruction technique.   CONTRAST:  OMNIPAQUE  IOHEXOL  350 MG/ML SOLN   COMPARISON:  Radiograph earlier today.  Chest CT 12/28/2020   FINDINGS: Cardiovascular: There are no filling defects within the pulmonary arteries to suggest pulmonary embolus. Atherosclerosis of the thoracic aorta. No dissection, aneurysm, or acute aortic findings. The heart is normal in size. Faint coronary artery calcifications. No pericardial effusion.   Mediastinum/Nodes: Right hilar masslike opacity may represent central lung lesion or nodal conglomerate, measuring 4.6 x 4.1 x 4.3 cm, series 4, image 60. This surrounds the right mainstem bronchus causing bronchial narrowing. Nodular component extends into the suprahilar right lung. Circumferential soft tissue thickening extends to the right lower lobe bronchi. 12 mm subcarinal node series 4, image 63. Left hilar nodes measure up to 8 mm series 4, image 62. 11 mm right thyroid nodule. Not clinically significant; no follow-up imaging recommended (ref: J Am Coll Radiol. 2015 Feb;12(2): 143-50).The esophagus is decompressed.   Lungs/Pleura: Right upper lobe thick-walled cystic lesion is contiguous with the right hilar soft tissue mass inferiorly. This lesion measures 3.6 x 3.5 x 4.5 cm, series 5,  image 47. There are multiple irregular nodular septations and mild peripheral irregularity. Right hilar masslike opacity in causes mass effect and luminal narrowing of the right mainstem bronchus. There is bronchial filling of segmental right upper lobe bronchi. Severe narrowing of the right bronchus intermedius. Near complete filling of the right lower lobe bronchus with opacified segmental and subsegmental right lower lobe bronchi. Faint tree-in-bud opacities in the inferior right lower lobe.   Mixed density cystic/necrotic lesion with adjacent ground-glass opacity in the left lower lobe measures 2.9 x 1.7 x 2.2 cm, series 5, image 64 and abuts  the inter lobar fissure   Right greater than left apical pleuroparenchymal scarring. No significant pleural effusion.   Upper Abdomen: Low-density left adrenal nodule measures 19 mm and is stable from prior exam consistent with benign adenoma. There is no right adrenal nodule. 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 14 mm porta hepatis node, series 4, image 144. Probable parapelvic cysts in the left kidney.   Musculoskeletal: No evidence of focal bone lesion. No subcutaneous soft tissue abnormality.   Review of the MIP images confirms the above findings.   IMPRESSION: 1. No pulmonary embolus. 2. Findings highly suspicious for intrathoracic malignancy. There are 2 irregular suspicious partially cystic lesions, 1 in the right upper lobe (3.6 x 3.5 x 4.5 cm) and 1 in the left lower lobe (2.9 x 1.7 x 2.2 cm), either of which could represent primary malignancy. Right upper lobe lesion is more suspicious, and contiguous with right hilar masslike opacity. The right hilar process may represent a central lung lesion or pathologic adenopathy. Right hilar lesion causes significant mass effect and narrowing of the right mainstem bronchus with right upper and lower lobe bronchial filling. Oncologic workup is recommended. 3. Faint tree-in-bud opacities in the inferior right lower lobe, likely infectious or inflammatory. 4. Mildly enlarged subcarinal lymph node. Nonspecific 8 mm left hilar lymph node. There is enlarged lymph node in the porta hepatis in the upper abdomen. 5. 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   Aortic Atherosclerosis (ICD10-I70.0).     Electronically Signed   By: Andrea Gasman M.D.   On: 01/27/2025 19:51     ASSESSMENT/PLAN   Right lung/hilar mass with mediastinal adenopathy         S/p oncology evaluation - appreciate input Dr Babara - tissue sampling         - reviewed imaging in detail  with patient - will schedule for biopsy asap after evaluation in clinic and presurgical anesthesia evluation.          -in the interim would recommend lasix  20mg  daily to reduce interstitial edema sorrounding mass and improve dyspnea  Community acquired pneumonia Agree with rocephin  zithromax , can narrow to doxycycline  BID for total of 5 day treatment -fungitell to rule out cavitary fungal lesion -strep pneumoniae urinary ag -legionella ab -resp cultures   COPD with acute exacerbation  Duoneb Prednisone  50mg  with taper Incentive spirometry and flutter valve PT/OT        Thank you for allowing me to participate in the care of this patient.   Patient/Family are satisfied with care plan and all questions have been answered.    Provider disclosure: Patient with at least one acute or chronic illness or injury that poses a threat to life or bodily function and is being managed actively during this encounter.  All of the below services have been performed independently  by signing provider:  review of prior documentation from internal and or external health records.  Review of previous and current lab results.  Interview and comprehensive assessment during patient visit today. Review of current and previous chest radiographs/CT scans. Discussion of management and test interpretation with health care team and patient/family.   This document was prepared using Dragon voice recognition software and may include unintentional dictation errors.     Anica Alcaraz, M.D.  Division of Pulmonary & Critical Care Medicine             [1]  Social History Tobacco Use   Smoking status: Every Day    Current packs/day: 0.00    Average packs/day: 0.3 packs/day    Types: Cigarettes    Last attempt to quit: 06/24/2019    Years since quitting: 5.6   Smokeless tobacco: Never   Tobacco comments:    1 1/5 ppd  Vaping Use   Vaping status: Never Used  Substance Use Topics   Alcohol use: No    Drug use: No  [2]  Current Facility-Administered Medications:    acetaminophen  (TYLENOL ) tablet 650 mg, 650 mg, Oral, Q6H PRN **OR** acetaminophen  (TYLENOL ) suppository 650 mg, 650 mg, Rectal, Q6H PRN, Mansy, Jan A, MD   ascorbic acid  (VITAMIN C ) tablet 1,000 mg, 1,000 mg, Oral, Daily, Mansy, Jan A, MD, 1,000 mg at 01/29/25 9146   aspirin  EC tablet 81 mg, 81 mg, Oral, Daily, Mansy, Jan A, MD, 81 mg at 01/29/25 9146   azithromycin  (ZITHROMAX ) tablet 500 mg, 500 mg, Oral, Daily, Nazari, Walid A, RPH, 500 mg at 01/29/25 9145   cefTRIAXone  (ROCEPHIN ) 2 g in sodium chloride  0.9 % 100 mL IVPB, 2 g, Intravenous, Q24H, Mansy, Jan A, MD, Stopped at 01/28/25 2218   chlorpheniramine-HYDROcodone (TUSSIONEX) 10-8 MG/5ML suspension 5 mL, 5 mL, Oral, Q12H PRN, Mansy, Jan A, MD, 5 mL at 01/29/25 0854   cholecalciferol  (VITAMIN D3) 25 MCG (1000 UNIT) tablet 1,000 Units, 1,000 Units, Oral, Daily, Mansy, Jan A, MD, 1,000 Units at 01/29/25 9146   cyanocobalamin  (VITAMIN B12) tablet 1,000 mcg, 1,000 mcg, Oral, Daily, Mansy, Jan A, MD, 1,000 mcg at 01/29/25 9145   enoxaparin  (LOVENOX ) injection 40 mg, 40 mg, Subcutaneous, Q24H, Mansy, Jan A, MD, 40 mg at 01/29/25 9145   guaiFENesin  (MUCINEX ) 12 hr tablet 600 mg, 600 mg, Oral, BID, Mansy, Jan A, MD, 600 mg at 01/29/25 9146   ipratropium-albuterol  (DUONEB) 0.5-2.5 (3) MG/3ML nebulizer solution 3 mL, 3 mL, Nebulization, Q4H PRN, Dail Rankin RAMAN, RPH   ipratropium-albuterol  (DUONEB) 0.5-2.5 (3) MG/3ML nebulizer solution 3 mL, 3 mL, Nebulization, TID, Fausto, Kelly A, DO   magnesium  hydroxide (MILK OF MAGNESIA) suspension 30 mL, 30 mL, Oral, Daily PRN, Mansy, Jan A, MD   ondansetron  (ZOFRAN ) tablet 4 mg, 4 mg, Oral, Q6H PRN **OR** ondansetron  (ZOFRAN ) injection 4 mg, 4 mg, Intravenous, Q6H PRN, Mansy, Jan A, MD   traZODone  (DESYREL ) tablet 25 mg, 25 mg, Oral, QHS PRN, Mansy, Jan A, MD, 25 mg at 01/28/25 2127  "

## 2025-01-29 NOTE — Care Management Important Message (Signed)
 Important Message  Patient Details  Name: Gary Whitehead MRN: [id number] Date of Birth: 1951-08-04   Important Message Given:  Yes - Medicare IM     Baillie Mohammad 01/29/2025, 12:55 PM

## 2025-02-10 ENCOUNTER — Encounter: Admitting: Dermatology

## 2025-06-29 ENCOUNTER — Ambulatory Visit: Admitting: Dermatology
